# Patient Record
Sex: Female | Born: 1970
Health system: Southern US, Community
[De-identification: ages and names within clinical notes are randomized; demographics above are authoritative.]

## PROBLEM LIST (undated history)

## (undated) DIAGNOSIS — D649 Anemia, unspecified: Secondary | ICD-10-CM

## (undated) DIAGNOSIS — I10 Essential (primary) hypertension: Secondary | ICD-10-CM

## (undated) HISTORY — DX: Essential (primary) hypertension: I10

## (undated) HISTORY — DX: Anemia, unspecified: D64.9

## (undated) HISTORY — PX: CHOLECYSTECTOMY: SHX55

## (undated) HISTORY — PX: LAPAROSCOPIC SALPINGO OOPHERECTOMY: SHX5927

---

## 2000-07-31 ENCOUNTER — Observation Stay (HOSPITAL_COMMUNITY): Admission: RE | Admit: 2000-07-31 | Discharge: 2000-08-01 | Payer: Self-pay | Admitting: *Deleted

## 2000-08-16 ENCOUNTER — Other Ambulatory Visit: Admission: RE | Admit: 2000-08-16 | Discharge: 2000-08-16 | Payer: Self-pay | Admitting: *Deleted

## 2001-04-25 ENCOUNTER — Ambulatory Visit (HOSPITAL_COMMUNITY): Admission: RE | Admit: 2001-04-25 | Discharge: 2001-04-25 | Payer: Self-pay | Admitting: Obstetrics and Gynecology

## 2001-04-25 ENCOUNTER — Encounter: Payer: Self-pay | Admitting: Obstetrics and Gynecology

## 2001-10-04 ENCOUNTER — Ambulatory Visit (HOSPITAL_COMMUNITY): Admission: RE | Admit: 2001-10-04 | Discharge: 2001-10-04 | Payer: Self-pay | Admitting: Obstetrics and Gynecology

## 2001-10-04 ENCOUNTER — Encounter: Payer: Self-pay | Admitting: Obstetrics and Gynecology

## 2001-12-25 ENCOUNTER — Emergency Department (HOSPITAL_COMMUNITY): Admission: EM | Admit: 2001-12-25 | Discharge: 2001-12-25 | Payer: Self-pay | Admitting: Emergency Medicine

## 2003-09-27 ENCOUNTER — Other Ambulatory Visit: Admission: RE | Admit: 2003-09-27 | Discharge: 2003-09-27 | Payer: Self-pay | Admitting: Obstetrics and Gynecology

## 2005-05-05 ENCOUNTER — Other Ambulatory Visit: Admission: RE | Admit: 2005-05-05 | Discharge: 2005-05-05 | Payer: Self-pay | Admitting: Obstetrics and Gynecology

## 2005-05-14 ENCOUNTER — Ambulatory Visit (HOSPITAL_COMMUNITY): Admission: RE | Admit: 2005-05-14 | Discharge: 2005-05-14 | Payer: Self-pay | Admitting: Obstetrics and Gynecology

## 2010-02-15 ENCOUNTER — Encounter: Payer: Self-pay | Admitting: Obstetrics and Gynecology

## 2010-11-27 ENCOUNTER — Other Ambulatory Visit: Payer: Self-pay | Admitting: Obstetrics and Gynecology

## 2010-11-27 DIAGNOSIS — Z1231 Encounter for screening mammogram for malignant neoplasm of breast: Secondary | ICD-10-CM

## 2010-12-04 ENCOUNTER — Ambulatory Visit
Admission: RE | Admit: 2010-12-04 | Discharge: 2010-12-04 | Disposition: A | Discharge status: Home / Self Care | Payer: BC Managed Care – PPO | Source: Ambulatory Visit | Attending: Obstetrics and Gynecology | Admitting: Obstetrics and Gynecology

## 2010-12-04 DIAGNOSIS — Z1231 Encounter for screening mammogram for malignant neoplasm of breast: Secondary | ICD-10-CM

## 2010-12-07 ENCOUNTER — Other Ambulatory Visit: Payer: Self-pay | Admitting: Obstetrics and Gynecology

## 2010-12-07 DIAGNOSIS — R928 Other abnormal and inconclusive findings on diagnostic imaging of breast: Secondary | ICD-10-CM

## 2010-12-25 ENCOUNTER — Ambulatory Visit
Admission: RE | Admit: 2010-12-25 | Discharge: 2010-12-25 | Disposition: A | Discharge status: Home / Self Care | Payer: BC Managed Care – PPO | Source: Ambulatory Visit | Attending: Obstetrics and Gynecology | Admitting: Obstetrics and Gynecology

## 2010-12-25 DIAGNOSIS — R928 Other abnormal and inconclusive findings on diagnostic imaging of breast: Secondary | ICD-10-CM

## 2011-05-24 ENCOUNTER — Telehealth: Payer: Self-pay | Admitting: Obstetrics and Gynecology

## 2011-05-24 NOTE — Telephone Encounter (Signed)
Lm on vm tcb rgd msg 

## 2011-05-24 NOTE — Telephone Encounter (Signed)
Routed to triage 

## 2011-05-25 NOTE — Telephone Encounter (Signed)
Spoke with pt rgd msg pt wants eval for irreg bleeding pt has appt 06/01/11 at 4:15 with ND pt voice understanding

## 2011-06-01 ENCOUNTER — Ambulatory Visit (INDEPENDENT_AMBULATORY_CARE_PROVIDER_SITE_OTHER): Payer: BC Managed Care – PPO | Admitting: Obstetrics and Gynecology

## 2011-06-01 ENCOUNTER — Encounter: Payer: Self-pay | Admitting: Obstetrics and Gynecology

## 2011-06-01 VITALS — BP 120/80 | Ht 61.0 in | Wt 128.0 lb

## 2011-06-01 DIAGNOSIS — N898 Other specified noninflammatory disorders of vagina: Secondary | ICD-10-CM

## 2011-06-01 DIAGNOSIS — A499 Bacterial infection, unspecified: Secondary | ICD-10-CM

## 2011-06-01 DIAGNOSIS — N926 Irregular menstruation, unspecified: Secondary | ICD-10-CM

## 2011-06-01 DIAGNOSIS — N760 Acute vaginitis: Secondary | ICD-10-CM

## 2011-06-01 LAB — POCT WET PREP (WET MOUNT)
Clue Cells Wet Prep Whiff POC: POSITIVE
KOH Wet Prep POC: NEGATIVE

## 2011-06-01 MED ORDER — METRONIDAZOLE 0.75 % VA GEL: 1.0000 | Freq: Every day | VAGINAL | Status: AC

## 2011-06-01 NOTE — Progress Notes (Signed)
Contraception: yes Fibroids: yes Hormone Therapy: yes New Medications: no Menopausal Symptoms: no Vag. Discharge: yes Abdominal Pain: yes Increased Stress: no  Pt states started cycle 05/12/11 light bleeding x 3 days then a week later had heavy bleeding x 5 days then a week later bleeding again for 5 days with mild cramping.  The bleeding started a week before it was supposed to.  She has not missed any ocps.  No bleeding disorders.   Physical Examination: General appearance - alert, well appearing, and in no distress Mental status - alert, oriented to person, place, and time Chest - clear to auscultation, no wheezes, rales or rhonchi, symmetric air entry Heart - normal rate and regular rhythm Abdomen - soft, nontender, nondistended, no masses or organomegaly Pelvic - normal external genitalia, vulva, vagina, cervix, uterus and adnexa.  Mild uterine tenderness and left adnexal tenderness Back exam - full range of motion, no tenderness, palpable spasm or pain on motion Musculoskeletal - no joint tenderness, deformity or swelling Extremities - peripheral pulses normal, no pedal edema, no clubbing or cyanosis Skin - normal coloration and turgor, no rashes, no suspicious skin lesions Rectal:  External hemorrhoid seen. Red and non tender Irregular vb Hemorrhoid BV wet prep sig for bv Pt declined  gc and chlamydia cultures US/SHG/EMBX @[facility]  Pt with osteopenia recommend to try different bc @[facility] 

## 2011-06-03 ENCOUNTER — Other Ambulatory Visit: Payer: Self-pay | Admitting: Obstetrics and Gynecology

## 2011-06-03 ENCOUNTER — Other Ambulatory Visit: Payer: Self-pay

## 2011-06-03 MED ORDER — HYDROCORTISONE ACE-PRAMOXINE 1-1 % RE FOAM: 1.0000 | Freq: Two times a day (BID) | RECTAL | Status: AC

## 2011-06-03 NOTE — Telephone Encounter (Signed)
Lm on vm tcb rgd msg 

## 2011-06-03 NOTE — Telephone Encounter (Signed)
Lm on vm rx sent to pharm 

## 2011-06-03 NOTE — Telephone Encounter (Signed)
Niccole addressing call

## 2011-06-04 ENCOUNTER — Telehealth: Payer: Self-pay | Admitting: Obstetrics and Gynecology

## 2011-06-04 NOTE — Telephone Encounter (Signed)
Hey Dr.D this pt was given rx for procto foam insurance will not cover is there something else we can call in

## 2011-06-04 NOTE — Telephone Encounter (Signed)
niccole/epic °

## 2011-06-09 MED ORDER — HYDROCORTISONE ACETATE 1 % EX OINT: TOPICAL_OINTMENT | CUTANEOUS | Status: DC

## 2011-06-09 NOTE — Telephone Encounter (Signed)
Lm on vm rx sent to pharm can pick up at early convience

## 2011-06-09 NOTE — Telephone Encounter (Signed)
Yes can use Annusol HC ointment BiD

## 2011-06-23 ENCOUNTER — Encounter: Payer: BC Managed Care – PPO | Admitting: Obstetrics and Gynecology

## 2011-06-28 ENCOUNTER — Encounter: Payer: BC Managed Care – PPO | Admitting: Obstetrics and Gynecology

## 2011-08-05 ENCOUNTER — Other Ambulatory Visit: Payer: Self-pay | Admitting: Obstetrics and Gynecology

## 2011-08-05 ENCOUNTER — Other Ambulatory Visit: Payer: Self-pay

## 2011-08-05 NOTE — Telephone Encounter (Signed)
NICCOLE/EPIC °

## 2011-08-06 ENCOUNTER — Other Ambulatory Visit: Payer: Self-pay

## 2011-08-06 MED ORDER — LOESTRIN 1/20 (21) 1-20 MG-MCG PO TABS: 1.0000 | ORAL_TABLET | Freq: Every day | ORAL | Status: DC

## 2011-08-06 MED ORDER — JUNEL FE 1/20 1-20 MG-MCG PO TABS: 1.0000 | ORAL_TABLET | Freq: Every day | ORAL | Status: DC

## 2011-08-06 NOTE — Telephone Encounter (Signed)
Lm on vm rx sent to pharm per protocol 

## 2011-09-06 ENCOUNTER — Other Ambulatory Visit: Payer: Self-pay

## 2011-09-06 MED ORDER — JUNEL FE 1/20 1-20 MG-MCG PO TABS: 1.0000 | ORAL_TABLET | Freq: Every day | ORAL | Status: DC

## 2011-09-30 ENCOUNTER — Telehealth: Payer: Self-pay

## 2011-09-30 ENCOUNTER — Other Ambulatory Visit: Payer: Self-pay

## 2011-09-30 MED ORDER — JUNEL FE 1/20 1-20 MG-MCG PO TABS: 1.0000 | ORAL_TABLET | Freq: Every day | ORAL | Status: DC

## 2011-09-30 NOTE — Telephone Encounter (Signed)
Lm on vm rx sent to pharm 

## 2011-10-11 ENCOUNTER — Ambulatory Visit: Payer: BC Managed Care – PPO | Admitting: Obstetrics and Gynecology

## 2011-10-27 ENCOUNTER — Telehealth: Payer: Self-pay | Admitting: Obstetrics and Gynecology

## 2011-10-27 NOTE — Telephone Encounter (Signed)
Approved rx refill request from pharmacy [facility] fe1-20 disp 1 pack 1po qd with 1 refill. Pt has a aex coming up. Pt aware of rx .

## 2011-11-19 ENCOUNTER — Other Ambulatory Visit: Payer: Self-pay | Admitting: Obstetrics and Gynecology

## 2011-11-19 DIAGNOSIS — Z1231 Encounter for screening mammogram for malignant neoplasm of breast: Secondary | ICD-10-CM

## 2011-12-03 ENCOUNTER — Ambulatory Visit (INDEPENDENT_AMBULATORY_CARE_PROVIDER_SITE_OTHER): Payer: BC Managed Care – PPO | Admitting: Obstetrics and Gynecology

## 2011-12-03 ENCOUNTER — Encounter: Payer: Self-pay | Admitting: Obstetrics and Gynecology

## 2011-12-03 VITALS — BP 116/74 | Ht 61.0 in | Wt 131.0 lb

## 2011-12-03 DIAGNOSIS — Z124 Encounter for screening for malignant neoplasm of cervix: Secondary | ICD-10-CM

## 2011-12-03 DIAGNOSIS — Z0000 Encounter for general adult medical examination without abnormal findings: Secondary | ICD-10-CM

## 2011-12-03 NOTE — Patient Instructions (Signed)
Laparoscopic Tubal Ligation Laparoscopic tubal ligation is a procedure that closes the fallopian tubes at a time other than right after childbirth. By closing the fallopian tubes, the eggs that are released from the ovaries cannot enter the uterus and sperm cannot reach the egg. Tubal ligation is also known as getting your "tubes tied." Tubal ligation is done so you will not be able to get pregnant or have a baby.  Although this procedure may be reversed, it should be considered permanent and irreversible. If you want to have future pregnancies, you should not have this procedure.  LET YOUR CAREGIVER KNOW ABOUT:  Allergies to food or medicine.  Medicines taken, including vitamins, herbs, eyedrops, over-the-counter medicines, and creams.  Use of steroids (by mouth or creams).  Previous problems with numbing medicines.  History of bleeding problems or blood clots.  Any recent colds or infections.  Previous surgery.  Other health problems, including diabetes and kidney problems.  Possibility of pregnancy, if this applies.  Any past pregnancies. RISKS AND COMPLICATIONS   Infection.  Bleeding.  Injury to surrounding organs.  Anesthetic side effects.  Failure of the procedure.  Ectopic pregnancy.  Future regret about having the procedure done. BEFORE THE PROCEDURE  Do not take aspirin or blood thinners a week before the procedure or as directed. This can cause bleeding.  Do not eat or drink anything 6 to 8 hours before the procedure. PROCEDURE   You may be given a medicine to help you relax (sedative) before the procedure. You will be given a medicine to make you sleep (general anesthetic) during the procedure.  A tube will be put down your throat to help your breath while under general anesthesia.  Two small cuts (incisions) are made in the lower abdominal area and near the belly button.  Your abdominal area will be inflated with a safe gas (carbon dioxide). This helps  give the surgeon room to operate, visualize, and helps the surgeon avoid other organs.  A thin, lighted tube (laparoscope) with a camera attached is inserted into your abdomen through one of the incisions near the belly button. Other small instruments are also inserted through the other abdominal incision.  The fallopian tubes are located and are either blocked with a ring, clip, or are burned (cauterized).  After the fallopian tubes are blocked, the gas is released from the abdomen.  The incisions will be closed with stitches (sutures), and a bandage may be placed over the incisions. AFTER THE PROCEDURE   You will rest in a recovery room for 1 4 hours until you are stable and doing well.  You will also have some mild abdominal discomfort for 3 7 days. You will be given pain medicine to ease any discomfort.  As long as there are no problems, you may be allowed to go home. Someone will need to drive you home and be with you for at least 24 hours once home.  You may have some mild discomfort in the throat. This is from the tube placed in your throat while you were sleeping.  You may experience discomfort in the shoulder area from some trapped air between the liver and diaphragm. This sensation is normal and will slowly go away on its own. Document Released: 04/19/2000 Document Revised: 07/13/2011 Document Reviewed: 04/24/2011 Exit[employer] Patient Information 2013 [employer].  

## 2011-12-03 NOTE — Progress Notes (Signed)
Last Pap: 2012 WNL: Yes Regular Periods:yes Contraception: junel  Monthly Breast exam:yes Tetanus<45yrs:yes Nl.Bladder Function:yes Daily BMs:yes Healthy Diet:yes Calcium:yes Mammogram:yes Date of Mammogram: 2012 wnl schd for next month Exercise:yes Have often Exercise: occ Seatbelt: yes Abuse at home: no Stressful work:no Sigmoid-colonoscopy: n/a Bone Density: No PCP: Dr.Payne Change in PMH: no change Change in FMH:no change BP 116/74  Ht 5\' 1"  (1.549 m)  Wt 131 lb (59.421 kg)  BMI 24.75 kg/m2  LMP 11/26/2011 Pt with complaints:yes Physical Examination: General appearance - alert, well appearing, and in no distress Mental status - normal mood, behavior, speech, dress, motor activity, and thought processes Neck - supple, no significant adenopathy,  thyroid exam: thyroid is normal in size without nodules or tenderness Chest - clear to auscultation, no wheezes, rales or rhonchi, symmetric air entry Heart - normal rate and regular rhythm Abdomen - soft, nontender, nondistended, no masses or organomegaly Breasts - breasts appear normal, no suspicious masses, no skin or nipple changes or axillary nodes Pelvic - normal external genitalia, vulva, vagina, cervix, uterus and adnexa Rectal - normal rectal, no masses, thrombosed external hemorrhoids Back exam - full range of motion, no tenderness, palpable spasm or pain on motion Neurological - alert, oriented, normal speech, no focal findings or movement disorder noted Musculoskeletal - no joint tenderness, deformity or swelling Extremities - no edema, redness or tenderness in the calves or thighs Skin - normal coloration and turgor, no rashes, no suspicious skin lesions noted Routine exam Pap sent no due 2015 Mammogram due yes scheduled next week OCP used for contraception.  Pt considering a tubal ligation.  She only has one remaining tube.  Info given on tubal RT 1 yr

## 2011-12-06 ENCOUNTER — Telehealth: Payer: Self-pay

## 2011-12-06 NOTE — Telephone Encounter (Signed)
Message copied by Rolla Plate on Mon Dec 06, 2011  9:26 AM ------      Message from: Jaymes Graff      Created: Fri Dec 03, 2011 10:04 AM       Please refer pt to Dr Loreta Ave to treat hemorrhoids

## 2011-12-06 NOTE — Telephone Encounter (Signed)
Spoke with pt informed appt time and date pt voice understanding 

## 2011-12-06 NOTE — Telephone Encounter (Signed)
Lm on vm tcb rgd referral pt has appt with Dr.Mann 12-14-11 at 10:30

## 2011-12-22 ENCOUNTER — Other Ambulatory Visit: Payer: Self-pay

## 2011-12-22 MED ORDER — NORETHIN ACE-ETH ESTRAD-FE 1-20 MG-MCG PO TABS: 1.0000 | ORAL_TABLET | Freq: Every day | ORAL | Status: DC

## 2011-12-31 ENCOUNTER — Ambulatory Visit
Admission: RE | Admit: 2011-12-31 | Discharge: 2011-12-31 | Disposition: A | Discharge status: Home / Self Care | Payer: BC Managed Care – PPO | Source: Ambulatory Visit | Attending: Obstetrics and Gynecology | Admitting: Obstetrics and Gynecology

## 2011-12-31 DIAGNOSIS — Z1231 Encounter for screening mammogram for malignant neoplasm of breast: Secondary | ICD-10-CM

## 2012-01-04 ENCOUNTER — Ambulatory Visit (INDEPENDENT_AMBULATORY_CARE_PROVIDER_SITE_OTHER): Payer: BC Managed Care – PPO | Admitting: General Surgery

## 2012-01-04 ENCOUNTER — Encounter (INDEPENDENT_AMBULATORY_CARE_PROVIDER_SITE_OTHER): Payer: Self-pay | Admitting: General Surgery

## 2012-01-04 VITALS — BP 130/86 | HR 80 | Temp 97.7°F | Resp 16 | Ht 61.0 in | Wt 130.8 lb

## 2012-01-04 DIAGNOSIS — K620 Anal polyp: Secondary | ICD-10-CM | POA: Insufficient documentation

## 2012-01-04 DIAGNOSIS — K621 Rectal polyp: Secondary | ICD-10-CM | POA: Insufficient documentation

## 2012-01-04 NOTE — Progress Notes (Signed)
Patient ID: Kathleen Orozco, female   DOB: [birth year], 41 y.o.   MRN: [id number]  No chief complaint on file.   HPI Kathleen Orozco is a 41 y.o. female.   HPI  She is referred by Dr. Loreta Ave for evaluation of a bleeding anal mass. She has been noticing post bowel movement blood on the tissue paper for about 4-5 months. No pain. She denies constipation. She saw Dr. Loreta Ave who identified a lesion prolapsing out of the anus. She has been sent over here for further evaluation and treatment.  Past Medical History  Diagnosis Date  . Hypertension   . Anemia     Past Surgical History  Procedure Date  . Cholecystectomy   . Laparoscopic salpingo oopherectomy     Family History  Problem Relation Age of Onset  . Hypertension Maternal Grandmother   . Breast cancer Cousin     Social History History  Substance Use Topics  . Smoking status: Never Smoker   . Smokeless tobacco: Not on file  . Alcohol Use: Not on file    Allergies  Allergen Reactions  . Reglan (Metoclopramide)     Current Outpatient Prescriptions  Medication Sig Dispense Refill  . hydrochlorothiazide (MICROZIDE) 12.5 MG capsule Take 12.5 mg by mouth daily.      . Hydrocortisone Acetate 1 % OINT Apply to area twice daily  1 Tube  0  . JUNEL FE 1/20 1-20 MG-MCG tablet Take 1 tablet by mouth daily.  1 Package  0  . norethindrone-ethinyl estradiol (JUNEL FE,GILDESS FE,LOESTRIN FE) 1-20 MG-MCG tablet Take 1 tablet by mouth daily.  1 Package  11  . Vitamin D, Ergocalciferol, (DRISDOL) 50000 UNITS CAPS Take 50,000 Units by mouth.        Review of Systems Review of Systems  Constitutional: Negative.   Respiratory: Negative.   Cardiovascular: Negative.   Gastrointestinal: Positive for anal bleeding.  Genitourinary: Negative.   Neurological: Positive for headaches.    Blood pressure 130/86, pulse 80, temperature 97.7 F (36.5 C), resp. rate 16, height 5\' 1"  (1.549 m), weight 130 lb 12.8 oz (59.33 kg), last menstrual period  12/17/2011.  Physical Exam Physical Exam  Constitutional: She appears well-developed and well-nourished. No distress.  Genitourinary:       Anorectal-right posterior lateral skin tag is noted. There is a prolapsing inflamed anal mass present. It is non-tender. It bleeds slightly when touched. No other anal masses noted. Normal sphincter tone on digital rectal exam.    Data Reviewed Knows from Dr. Loreta Ave.  Assessment    Bleeding, prolapsing anal mass. Most likely in an inflamed internal hemorrhoid versus benign anal lesion. It is not amenable to office treatment.    Plan    Removal of anal mass at outpatient surgical Center. The procedure, rationale, and risks were discussed with her. The risks include but are not limited to bleeding, infection, anesthesia, recurrence. We also talked about light activities afterwards. She seems to understand this. She will call back when she wants to schedule the surgery.       Han Vejar J 01/04/2012, 3:57 PM

## 2012-01-04 NOTE — Patient Instructions (Signed)
Call when you are ready to schedule surgery, [phone], and ask for a surgery scheduler.

## 2012-01-10 ENCOUNTER — Encounter: Payer: Self-pay | Admitting: Obstetrics and Gynecology

## 2012-01-11 ENCOUNTER — Encounter (INDEPENDENT_AMBULATORY_CARE_PROVIDER_SITE_OTHER): Payer: Self-pay

## 2012-01-13 ENCOUNTER — Other Ambulatory Visit (INDEPENDENT_AMBULATORY_CARE_PROVIDER_SITE_OTHER): Payer: Self-pay | Admitting: General Surgery

## 2012-01-13 ENCOUNTER — Telehealth (INDEPENDENT_AMBULATORY_CARE_PROVIDER_SITE_OTHER): Payer: Self-pay

## 2012-01-13 NOTE — Telephone Encounter (Signed)
LMOV to call office.  Pt needs to stop ASA 5 days prior to her surgery.  Orders written today by Dr. Abbey Chatters.

## 2012-01-14 ENCOUNTER — Telehealth (INDEPENDENT_AMBULATORY_CARE_PROVIDER_SITE_OTHER): Payer: Self-pay | Admitting: General Surgery

## 2012-01-14 NOTE — Telephone Encounter (Signed)
Patient called back this morning and I read message to her that she need to stop ASA 5 days before surgery and she wanted to talk to scheduler and I transferred to them

## 2012-01-24 ENCOUNTER — Ambulatory Visit (INDEPENDENT_AMBULATORY_CARE_PROVIDER_SITE_OTHER): Payer: Self-pay | Admitting: General Surgery

## 2012-02-04 ENCOUNTER — Ambulatory Visit (INDEPENDENT_AMBULATORY_CARE_PROVIDER_SITE_OTHER): Payer: Self-pay | Admitting: General Surgery

## 2012-03-23 DIAGNOSIS — K648 Other hemorrhoids: Secondary | ICD-10-CM

## 2012-03-23 HISTORY — PX: MASS EXCISION: SHX2000

## 2012-03-24 ENCOUNTER — Telehealth (INDEPENDENT_AMBULATORY_CARE_PROVIDER_SITE_OTHER): Payer: Self-pay | Admitting: General Surgery

## 2012-03-24 NOTE — Telephone Encounter (Signed)
Left message for patient to call office for po visit day and time  I also mailed  appt card

## 2012-03-24 NOTE — Telephone Encounter (Signed)
Patient called back and updated that her PO appt is 04/20/12 and that we need her to arrive at 230p for a 250p appt.  Patient agreeable with appt time at this time.

## 2012-04-20 ENCOUNTER — Ambulatory Visit (INDEPENDENT_AMBULATORY_CARE_PROVIDER_SITE_OTHER): Payer: BC Managed Care – PPO | Admitting: General Surgery

## 2012-04-20 ENCOUNTER — Encounter (INDEPENDENT_AMBULATORY_CARE_PROVIDER_SITE_OTHER): Payer: Self-pay | Admitting: General Surgery

## 2012-04-20 VITALS — BP 110/80 | HR 84 | Temp 98.0°F | Resp 16 | Ht 61.0 in | Wt 133.0 lb

## 2012-04-20 DIAGNOSIS — Z9889 Other specified postprocedural states: Secondary | ICD-10-CM

## 2012-04-20 NOTE — Progress Notes (Signed)
Procedure:  Excision of prolapsing anal mass and right single column hemorrhoidectomy  Date:  03/23/2012  Pathology:  Benign hemorrhoidal tissue  History: She is here for her first postoperative visit and is doing well. If she has a hard stool, she may see a little bleeding after having a bowel movement.  Exam: General- Is in NAD. Anorectal-very small open wound present around 7:00 position internally.  Assessment:  Doing well postoperatively to  Plan:  Start high-fiber diet and stay well hydrated. Return as needed.

## 2012-04-20 NOTE — Patient Instructions (Addendum)
Try a high fiber diet with more raw fruits, raw vegetables, and grains. Stay well hydrated.

## 2012-12-11 ENCOUNTER — Other Ambulatory Visit: Payer: Self-pay

## 2012-12-11 DIAGNOSIS — Z1231 Encounter for screening mammogram for malignant neoplasm of breast: Secondary | ICD-10-CM

## 2013-01-12 ENCOUNTER — Ambulatory Visit
Admission: RE | Admit: 2013-01-12 | Discharge: 2013-01-12 | Disposition: A | Discharge status: Home / Self Care | Payer: BC Managed Care – PPO | Source: Ambulatory Visit

## 2013-01-12 DIAGNOSIS — Z1231 Encounter for screening mammogram for malignant neoplasm of breast: Secondary | ICD-10-CM

## 2013-06-15 ENCOUNTER — Other Ambulatory Visit: Payer: Self-pay | Admitting: Obstetrics and Gynecology

## 2013-07-16 ENCOUNTER — Other Ambulatory Visit (HOSPITAL_COMMUNITY): Payer: Self-pay | Admitting: Obstetrics and Gynecology

## 2013-07-16 NOTE — H&P (Signed)
Kathleen Orozco is a 43 y.o. female  P  1-0-1-1 for tubal sterilization because of her desire to prevent childbearing.  The patient has a history of heavy menses that she describes as lasting for 5 days with a super tampon change 5 times a day.  These periods would be  accompanied by cramping that she rated as 4/10 on a 10 point pain scale.  Subsequently, she began oral contraceptives that now have her only needing a panty liner twice a day and no cramping at all for her 3 day "flow".  She denies any bladder or bowel issues, inter-menstrual bleeding or dyspareunia.  She has decided that she no longer wants to preserve her childbearing potential and has therefore consented to undergo bilateral salpingectomy.   Past Medical History  OB History: G: 2,   P: 1-0-1-1;  SVB 1989  GYN History: menarche:43 YO    LMP: 07/09/2013    Contracepton: Birth Control Pills  Denies history of abnormal PAP smear or STDs;  Last PAP smear: 2012  Medical History: Hypertension, Vitamin D Deficiency and Anemia  Surgical History: 1990 Cholecystecomy;  2002 Laparoscopic Salpingectomy;  2014 Excision of Anal Polyp Denies problems with anesthesia or history of blood transfusions  Family History: Hypertension and Breast Cancer (maternal cousin in 56th decade)  Social History: Widow;  Denies tobacco use but occasionally uses alcohol   Medication  Valsartan 160 mg-HCTZ 25 mg daily Gildess 1/20 daily Vitamin D 50, 000 units weekly   Allergies  Allergen Reactions  . Reglan [Metoclopramide]     Denies sensitivity to peanuts, shellfish, soy, latex or adhesives.   ROS: Admits to glasses but   denies headache, vision changes, nasal congestion, dysphagia, tinnitus, dizziness, hoarseness, cough,  chest pain, shortness of breath, nausea, vomiting, diarrhea,constipation,  urinary frequency, urgency  dysuria, hematuria, vaginitis symptoms, pelvic pain, swelling of joints,easy bruising,  myalgias, arthralgias, skin rashes,  unexplained weight loss and except as is mentioned in the history of present illness, patient's review of systems is otherwise negative.  Physical Exam  Bp: 116/70   P: 80   R: 18  Temperature: 99.1 degrees F orally    Weight: 138 lbs.  Height: 5'1"   BMI: 26.1  Neck: supple without masses or thyromegaly Lungs: clear to auscultation Heart: regular rate and rhythm Abdomen: soft, non-tender and no organomegaly Pelvic:EGBUS- wnl; vagina-normal rugae; uterus-normal size, cervix without lesions or motion tenderness; adnexae-no tenderness or masses Extremities:  no clubbing, cyanosis or edema   Assesment: Desire for Permanent Sterilzation   Disposition:  A discussion was held with patient regarding the indication for her procedure(s) along with the risks, which include but are not limited to: reaction to anesthesia, damage to adjacent organs, infection and excessive bleeding.  The patient verbalized understanding of these risks and has consented to proceed with Bilateral Salpingectomy at [place] [facility] on July 19, 2013.   CSN# [id number]   [place] J. Florene Glen, PA-C  for Dr. Franklyn Lor. Dillard

## 2013-07-17 ENCOUNTER — Encounter (HOSPITAL_COMMUNITY)
Admission: RE | Admit: 2013-07-17 | Discharge: 2013-07-17 | Disposition: A | Discharge status: Home / Self Care | Payer: BC Managed Care – PPO | Source: Ambulatory Visit | Attending: Obstetrics and Gynecology | Admitting: Obstetrics and Gynecology

## 2013-07-17 ENCOUNTER — Other Ambulatory Visit: Payer: Self-pay

## 2013-07-17 ENCOUNTER — Encounter (HOSPITAL_COMMUNITY): Payer: Self-pay

## 2013-07-17 LAB — BASIC METABOLIC PANEL
BUN: 10 mg/dL (ref 6–23)
CO2: 27 mEq/L (ref 19–32)
Calcium: 9.8 mg/dL (ref 8.4–10.5)
Chloride: 101 mEq/L (ref 96–112)
Creatinine, Ser: 0.84 mg/dL (ref 0.50–1.10)
GFR calc Af Amer: >90 mL/min (ref >90)
GFR, EST NON AFRICAN AMERICAN: 84 mL/min — AB (ref >90)
GLUCOSE: 110 mg/dL — AB (ref 70–99)
POTASSIUM: 4 meq/L (ref 3.7–5.3)
SODIUM: 139 meq/L (ref 137–147)

## 2013-07-17 LAB — CBC
HCT: 42.7 % (ref 36.0–46.0)
HEMOGLOBIN: 14.2 g/dL (ref 12.0–15.0)
MCH: 29.8 pg (ref 26.0–34.0)
MCHC: 33.3 g/dL (ref 30.0–36.0)
MCV: 89.5 fL (ref 78.0–100.0)
Platelets: 370 10*3/uL (ref 150–400)
RBC: 4.77 MIL/uL (ref 3.87–5.11)
RDW: 13.3 % (ref 11.5–15.5)
WBC: 5.2 10*3/uL (ref 4.0–10.5)

## 2013-07-17 NOTE — Patient Instructions (Signed)
Your procedure is scheduled on:07/19/13  Enter through the Main Entrance at :[facility] up desk phone and dial [phone] and inform us of your arrival.  Please call [phone] if you have any problems the morning of surgery.  Remember: Do not eat food or drink liquids, including water, after midnight:WED.   You may brush your teeth the morning of surgery.   Take these meds the morning of surgery with a sip of water:Valsartan  DO NOT wear jewelry, eye make-up, lipstick,body lotion, or fingernail polish.  (Polished toes are ok) You may wear deodorant.   Patients discharged on the day of surgery will not be allowed to drive home. Wear loose fitting, comfortable clothes for your ride home.

## 2013-07-19 ENCOUNTER — Ambulatory Visit (HOSPITAL_COMMUNITY): Payer: BC Managed Care – PPO | Admitting: Anesthesiology

## 2013-07-19 ENCOUNTER — Ambulatory Visit (HOSPITAL_COMMUNITY)
Admission: RE | Admit: 2013-07-19 | Discharge: 2013-07-19 | Disposition: A | Discharge status: Home / Self Care | Payer: BC Managed Care – PPO | Source: Ambulatory Visit | Attending: Obstetrics and Gynecology | Admitting: Obstetrics and Gynecology

## 2013-07-19 ENCOUNTER — Encounter (HOSPITAL_COMMUNITY)
Admission: RE | Disposition: A | Discharge status: Home / Self Care | Payer: Self-pay | Source: Ambulatory Visit | Attending: Obstetrics and Gynecology

## 2013-07-19 ENCOUNTER — Encounter (HOSPITAL_COMMUNITY): Payer: BC Managed Care – PPO | Admitting: Anesthesiology

## 2013-07-19 DIAGNOSIS — I10 Essential (primary) hypertension: Secondary | ICD-10-CM | POA: Insufficient documentation

## 2013-07-19 DIAGNOSIS — D649 Anemia, unspecified: Secondary | ICD-10-CM | POA: Insufficient documentation

## 2013-07-19 DIAGNOSIS — D259 Leiomyoma of uterus, unspecified: Secondary | ICD-10-CM | POA: Insufficient documentation

## 2013-07-19 DIAGNOSIS — Z641 Problems related to multiparity: Secondary | ICD-10-CM | POA: Insufficient documentation

## 2013-07-19 DIAGNOSIS — Z302 Encounter for sterilization: Secondary | ICD-10-CM | POA: Insufficient documentation

## 2013-07-19 DIAGNOSIS — Z888 Allergy status to other drugs, medicaments and biological substances status: Secondary | ICD-10-CM | POA: Insufficient documentation

## 2013-07-19 HISTORY — PX: LAPAROSCOPIC BILATERAL SALPINGECTOMY: SHX5889

## 2013-07-19 LAB — PREGNANCY, URINE: PREG TEST UR: NEGATIVE

## 2013-07-19 SURGERY — SALPINGECTOMY, BILATERAL, LAPAROSCOPIC
Anesthesia: General | Site: Abdomen | Laterality: Right

## 2013-07-19 MED ORDER — PROMETHAZINE HCL 25 MG/ML IJ SOLN: 6.2500 mg | INTRAMUSCULAR | Status: DC | PRN

## 2013-07-19 MED ORDER — NEOSTIGMINE METHYLSULFATE 10 MG/10ML IV SOLN
INTRAVENOUS | Status: AC
  Filled 2013-07-19: qty 1

## 2013-07-19 MED ORDER — 0.9 % SODIUM CHLORIDE (POUR BTL) OPTIME
TOPICAL | Status: DC | PRN
  Administered 2013-07-19: 1000 mL

## 2013-07-19 MED ORDER — PROPOFOL 10 MG/ML IV BOLUS
INTRAVENOUS | Status: DC | PRN
  Administered 2013-07-19: 150 mg via INTRAVENOUS
  Administered 2013-07-19: 50 mg via INTRAVENOUS

## 2013-07-19 MED ORDER — DEXAMETHASONE SODIUM PHOSPHATE 10 MG/ML IJ SOLN
INTRAMUSCULAR | Status: AC
  Filled 2013-07-19: qty 1

## 2013-07-19 MED ORDER — GLYCOPYRROLATE 0.2 MG/ML IJ SOLN
INTRAMUSCULAR | Status: AC
  Filled 2013-07-19: qty 3

## 2013-07-19 MED ORDER — KETOROLAC TROMETHAMINE 30 MG/ML IJ SOLN
INTRAMUSCULAR | Status: AC
  Filled 2013-07-19: qty 1

## 2013-07-19 MED ORDER — IBUPROFEN 600 MG PO TABS: 600.0000 mg | ORAL_TABLET | Freq: Three times a day (TID) | ORAL | Status: DC | PRN

## 2013-07-19 MED ORDER — BUPIVACAINE-EPINEPHRINE 0.25% -1:200000 IJ SOLN
INTRAMUSCULAR | Status: DC | PRN
  Administered 2013-07-19: 14 mL

## 2013-07-19 MED ORDER — LIDOCAINE HCL (CARDIAC) 20 MG/ML IV SOLN
INTRAVENOUS | Status: AC
  Filled 2013-07-19: qty 5

## 2013-07-19 MED ORDER — DEXAMETHASONE SODIUM PHOSPHATE 10 MG/ML IJ SOLN
INTRAMUSCULAR | Status: DC | PRN
  Administered 2013-07-19: 10 mg via INTRAVENOUS

## 2013-07-19 MED ORDER — BUPIVACAINE-EPINEPHRINE (PF) 0.25% -1:200000 IJ SOLN
INTRAMUSCULAR | Status: AC
  Filled 2013-07-19: qty 30

## 2013-07-19 MED ORDER — ROCURONIUM BROMIDE 100 MG/10ML IV SOLN
INTRAVENOUS | Status: DC | PRN
  Administered 2013-07-19: 40 mg via INTRAVENOUS

## 2013-07-19 MED ORDER — OXYCODONE-ACETAMINOPHEN 5-325 MG PO TABS: 1.0000 | ORAL_TABLET | Freq: Four times a day (QID) | ORAL | Status: DC | PRN

## 2013-07-19 MED ORDER — FENTANYL CITRATE 0.05 MG/ML IJ SOLN
INTRAMUSCULAR | Status: DC | PRN
  Administered 2013-07-19: 50 ug via INTRAVENOUS
  Administered 2013-07-19 (×2): 100 ug via INTRAVENOUS

## 2013-07-19 MED ORDER — MIDAZOLAM HCL 2 MG/2ML IJ SOLN
INTRAMUSCULAR | Status: DC | PRN
  Administered 2013-07-19: 2 mg via INTRAVENOUS

## 2013-07-19 MED ORDER — FENTANYL CITRATE 0.05 MG/ML IJ SOLN
INTRAMUSCULAR | Status: AC
  Filled 2013-07-19: qty 2

## 2013-07-19 MED ORDER — KETOROLAC TROMETHAMINE 30 MG/ML IJ SOLN: 15.0000 mg | Freq: Once | INTRAMUSCULAR | Status: DC | PRN

## 2013-07-19 MED ORDER — MIDAZOLAM HCL 2 MG/2ML IJ SOLN
INTRAMUSCULAR | Status: AC
  Filled 2013-07-19: qty 2

## 2013-07-19 MED ORDER — LACTATED RINGERS IV SOLN
INTRAVENOUS | Status: DC
  Administered 2013-07-19 (×3): via INTRAVENOUS

## 2013-07-19 MED ORDER — FENTANYL CITRATE 0.05 MG/ML IJ SOLN
INTRAMUSCULAR | Status: AC
  Filled 2013-07-19: qty 5

## 2013-07-19 MED ORDER — LIDOCAINE HCL (CARDIAC) 20 MG/ML IV SOLN
INTRAVENOUS | Status: DC | PRN
  Administered 2013-07-19: 100 mg via INTRAVENOUS

## 2013-07-19 MED ORDER — FENTANYL CITRATE 0.05 MG/ML IJ SOLN: 25.0000 ug | INTRAMUSCULAR | Status: DC | PRN

## 2013-07-19 MED ORDER — ONDANSETRON HCL 4 MG/2ML IJ SOLN
INTRAMUSCULAR | Status: AC
  Filled 2013-07-19: qty 2

## 2013-07-19 MED ORDER — PROPOFOL 10 MG/ML IV EMUL
INTRAVENOUS | Status: AC
  Filled 2013-07-19: qty 20

## 2013-07-19 MED ORDER — ROCURONIUM BROMIDE 100 MG/10ML IV SOLN
INTRAVENOUS | Status: AC
  Filled 2013-07-19: qty 1

## 2013-07-19 MED ORDER — MEPERIDINE HCL 25 MG/ML IJ SOLN: 6.2500 mg | INTRAMUSCULAR | Status: DC | PRN

## 2013-07-19 SURGICAL SUPPLY — 26 items
CHLORAPREP W/TINT 26ML (MISCELLANEOUS) ×2 IMPLANT
CLOTH BEACON ORANGE TIMEOUT ST (SAFETY) ×2 IMPLANT
DERMABOND ADVANCED (GAUZE/BANDAGES/DRESSINGS) ×1
DERMABOND ADVANCED .7 DNX12 (GAUZE/BANDAGES/DRESSINGS) ×1 IMPLANT
DRSG COVADERM PLUS 2X2 (GAUZE/BANDAGES/DRESSINGS) ×4 IMPLANT
DRSG OPSITE POSTOP 3X4 (GAUZE/BANDAGES/DRESSINGS) ×2 IMPLANT
EVACUATOR SMOKE 8.L (FILTER) ×2 IMPLANT
FORCEPS CUTTING 33CM 5MM (CUTTING FORCEPS) ×2 IMPLANT
GLOVE BIO SURGEON STRL SZ 6.5 (GLOVE) ×2 IMPLANT
GLOVE BIOGEL PI IND STRL 7.0 (GLOVE) ×2 IMPLANT
GLOVE BIOGEL PI INDICATOR 7.0 (GLOVE) ×2
GOWN STRL REUS W/TWL LRG LVL3 (GOWN DISPOSABLE) ×4 IMPLANT
NS IRRIG 1000ML POUR BTL (IV SOLUTION) ×2 IMPLANT
PACK LAPAROSCOPY BASIN (CUSTOM PROCEDURE TRAY) ×2 IMPLANT
PROTECTOR NERVE ULNAR (MISCELLANEOUS) ×4 IMPLANT
SET IRRIG TUBING LAPAROSCOPIC (IRRIGATION / IRRIGATOR) IMPLANT
SOLUTION ELECTROLUBE (MISCELLANEOUS) ×2 IMPLANT
SUT MNCRL AB 3-0 PS2 27 (SUTURE) ×2 IMPLANT
SUT VICRYL 0 UR6 27IN ABS (SUTURE) ×4 IMPLANT
TOWEL OR 17X24 6PK STRL BLUE (TOWEL DISPOSABLE) ×4 IMPLANT
TRAY FOLEY CATH 14FR (SET/KITS/TRAYS/PACK) ×2 IMPLANT
TROCAR BALLN 12MMX100 BLUNT (TROCAR) ×2 IMPLANT
TROCAR XCEL NON-BLD 5MMX100MML (ENDOMECHANICALS) ×2 IMPLANT
TROCAR XCEL OPT SLVE 5M 100M (ENDOMECHANICALS) ×2 IMPLANT
WARMER LAPAROSCOPE (MISCELLANEOUS) ×2 IMPLANT
WATER STERILE IRR 1000ML POUR (IV SOLUTION) IMPLANT

## 2013-07-19 NOTE — Anesthesia Procedure Notes (Signed)
Procedure Name: Intubation Date/Time: 07/19/2013 9:43 AM Performed by: STERLING, Sheron Nightingale Pre-anesthesia Checklist: Patient identified, Patient being monitored, Emergency Drugs available, Timeout performed and Suction available Patient Re-evaluated:Patient Re-evaluated prior to inductionOxygen Delivery Method: Circle system utilized Preoxygenation: Pre-oxygenation with 100% oxygen Intubation Type: IV induction Laryngoscope Size: Mac and 3 Grade View: Grade I Tube size: 7.0 mm Number of attempts: 1 Placement Confirmation: ETT inserted through vocal cords under direct vision,  positive ETCO2 and breath sounds checked- equal and bilateral Secured at: 20 cm Dental Injury: Teeth and Oropharynx as per pre-operative assessment

## 2013-07-19 NOTE — Interval H&P Note (Signed)
History and Physical Interval Note:  07/19/2013 9:22 AM  Kathleen Orozco  has presented today for surgery, with the diagnosis of Desires surgical sterilization  The various methods of treatment have been discussed with the patient and family. After consideration of risks, benefits and other options for treatment, the patient has consented to  Procedure(s): LAPAROSCOPIC BILATERAL SALPINGECTOMY (Bilateral) as a surgical intervention .  The patient's history has been reviewed, patient examined, no change in status, stable for surgery.  I have reviewed the patient's chart and labs.  Questions were answered to the patient's satisfaction.     [facility]

## 2013-07-19 NOTE — Anesthesia Preprocedure Evaluation (Addendum)
Anesthesia Evaluation  Patient identified by MRN, date of birth, ID band Patient awake    Reviewed: Allergy & Precautions, H&P , NPO status , Patient's Chart, lab work & pertinent test results, reviewed documented beta blocker date and time   History of Anesthesia Complications Negative for: history of anesthetic complications  Airway Mallampati: III TM Distance: >3 FB Neck ROM: full    Dental  (+) Teeth Intact   Pulmonary neg pulmonary ROS,  breath sounds clear to auscultation  Pulmonary exam normal       Cardiovascular Exercise Tolerance: Good hypertension, On Medications Rhythm:regular Rate:Normal     Neuro/Psych negative neurological ROS  negative psych ROS   GI/Hepatic negative GI ROS, Neg liver ROS,   Endo/Other  negative endocrine ROS  Renal/GU negative Renal ROS  negative genitourinary   Musculoskeletal   Abdominal   Peds  Hematology negative hematology ROS (+)   Anesthesia Other Findings Allergy to Reglan  Reproductive/Obstetrics negative OB ROS                         Anesthesia Physical Anesthesia Plan  ASA: II  Anesthesia Plan: General ETT   Post-op Pain Management:    Induction:   Airway Management Planned:   Additional Equipment:   Intra-op Plan:   Post-operative Plan:   Informed Consent: I have reviewed the patients History and Physical, chart, labs and discussed the procedure including the risks, benefits and alternatives for the proposed anesthesia with the patient or authorized representative who has indicated his/her understanding and acceptance.   Dental Advisory Given  Plan Discussed with: CRNA and Surgeon  Anesthesia Plan Comments:         Anesthesia Quick Evaluation

## 2013-07-19 NOTE — Transfer of Care (Signed)
Immediate Anesthesia Transfer of Care Note  Patient: Kathleen Orozco  Procedure(s) Performed: Procedure(s): OPERATIVE LAPAROSCOPY, LYSIS OF ADHESIONS, LAPAROSCOPIC RIGHT SALPINGECTOMY (Right)  Patient Location: PACU  Anesthesia Type:General  Level of Consciousness: awake, alert  and oriented  Airway & Oxygen Therapy: Patient Spontanous Breathing and Patient connected to nasal cannula oxygen  Post-op Assessment: Report given to PACU RN and Post -op Vital signs reviewed and stable  Post vital signs: Reviewed and stable  Complications: No apparent anesthesia complications

## 2013-07-19 NOTE — H&P (View-Only) (Signed)
Kathleen Orozco is a 43 y.o. female  P  1-0-1-1 for tubal sterilization because of her desire to prevent childbearing.  The patient has a history of heavy menses that she describes as lasting for 5 days with a super tampon change 5 times a day.  These periods would be  accompanied by cramping that she rated as 4/10 on a 10 point pain scale.  Subsequently, she began oral contraceptives that now have her only needing a panty liner twice a day and no cramping at all for her 3 day "flow".  She denies any bladder or bowel issues, inter-menstrual bleeding or dyspareunia.  She has decided that she no longer wants to preserve her childbearing potential and has therefore consented to undergo bilateral salpingectomy.   Past Medical History  OB History: G: 2,   P: 1-0-1-1;  SVB 1989  GYN History: menarche:43 YO    LMP: 07/09/2013    Contracepton: Birth Control Pills  Denies history of abnormal PAP smear or STDs;  Last PAP smear: 2012  Medical History: Hypertension, Vitamin D Deficiency and Anemia  Surgical History: 1990 Cholecystecomy;  2002 Laparoscopic Salpingectomy;  2014 Excision of Anal Polyp Denies problems with anesthesia or history of blood transfusions  Family History: Hypertension and Breast Cancer (maternal cousin in 86th decade)  Social History: Widow;  Denies tobacco use but occasionally uses alcohol   Medication  Valsartan 160 mg-HCTZ 25 mg daily Gildess 1/20 daily Vitamin D 50, 000 units weekly   Allergies  Allergen Reactions  . Reglan [Metoclopramide]     Denies sensitivity to peanuts, shellfish, soy, latex or adhesives.   ROS: Admits to glasses but   denies headache, vision changes, nasal congestion, dysphagia, tinnitus, dizziness, hoarseness, cough,  chest pain, shortness of breath, nausea, vomiting, diarrhea,constipation,  urinary frequency, urgency  dysuria, hematuria, vaginitis symptoms, pelvic pain, swelling of joints,easy bruising,  myalgias, arthralgias, skin rashes,  unexplained weight loss and except as is mentioned in the history of present illness, patient's review of systems is otherwise negative.  Physical Exam  Bp: 116/70   P: 80   R: 18  Temperature: 99.1 degrees F orally    Weight: 138 lbs.  Height: 5'1"   BMI: 26.1  Neck: supple without masses or thyromegaly Lungs: clear to auscultation Heart: regular rate and rhythm Abdomen: soft, non-tender and no organomegaly Pelvic:EGBUS- wnl; vagina-normal rugae; uterus-normal size, cervix without lesions or motion tenderness; adnexae-no tenderness or masses Extremities:  no clubbing, cyanosis or edema   Assesment: Desire for Permanent Sterilzation   Disposition:  A discussion was held with patient regarding the indication for her procedure(s) along with the risks, which include but are not limited to: reaction to anesthesia, damage to adjacent organs, infection and excessive bleeding.  The patient verbalized understanding of these risks and has consented to proceed with Bilateral Salpingectomy at [facility] on July 19, 2013.   CSN# [id number]   [place] J. Florene Glen, PA-C  for Dr. Franklyn Lor. Dillard

## 2013-07-19 NOTE — Anesthesia Postprocedure Evaluation (Signed)
  Anesthesia Post-op Note  Anesthesia Post Note  Patient: Kathleen Orozco  Procedure(s) Performed: Procedure(s) (LRB): OPERATIVE LAPAROSCOPY, LYSIS OF ADHESIONS, LAPAROSCOPIC RIGHT SALPINGECTOMY (Right)  Anesthesia type: General  Patient location: PACU  Post pain: Pain level controlled  Post assessment: Post-op Vital signs reviewed  Last Vitals:  Filed Vitals:   07/19/13 1307  BP:   Pulse: 94  Temp:   Resp: 15    Post vital signs: Reviewed  Level of consciousness: sedated  Complications: No apparent anesthesia complications

## 2013-07-19 NOTE — Op Note (Signed)
Pre-operative Diagnosis: Multiparity desires sterilization  Post-operative Diagnosis: same  Surgeon: SEGBTDV,VOHYW A   Assistants: Earnstine Regal PA  Anesthesia: General endotracheal anesthesia   Procedure : Laparoscopic Right Salpingectomy, LOA  Procedure Details  The patient was seen in the Holding Room. The risks, benefits, complications, treatment options, and expected outcomes were discussed with the patient. The possibilities of reaction to medication, pulmonary aspiration, perforation of viscus, bleeding, recurrent infection, the need for additional procedures, failure to diagnose a condition, and creating a complication requiring transfusion or operation were discussed with the patient. The patient concurred with the proposed plan, giving informed consent. The patient was taken to the Operating Room, identified as Kathleen Orozco and the procedure verified as Diagnostic Laparoscopy with B sallpingecotmy. A Time Out was held and the above information confirmed.  After induction of general anesthesia, the patient was placed in modified dorsal lithotomy position where she was prepped, draped, and catheterized in the normal, sterile fashion. A foley catheter was placed..  The cervix was visualized and an intrauterine manipulator was placed. A 2 cm umbilical incision was then performed.and carried down to the fascia.  The fascia was then opened and extended bilaterally.  Peritoneum was then entered.  o vicryl was then placed around the fascia in a circumferential fashion.   The hasson was placed and ancored to the suture.. Normal pelvic anatomy was noted.   Uterus had  Two fibroids about 1-2 cm in size.  The pt had dense adhesions from the bowel to the anterior abdominal wall and from the bowel to the uterus.     The anterior and  Posterior culdesac and liver appeared normal.  The adhesions to the anterior abdominal wall were lysed with scissors before the 5 mmtrocars were placed.  The  adhesions were in the right lower quadrant.     Two 46mm trocars were placed in the right and left lower quadrants of the abdomen under direct visualization of the laparoscope.   The left tube was surgically absent.  The adhesions on the right side from the bowel to the uterus were lysed and the right tube and ovary were carefully diseccted out.  Using the gyrus the right fallopian tube was cauterized, cut and removed from the abdomen.   The tube was removed and sent to pathology .  Irrigation was done with 60 cc syringe.    Hemostasis was noted.  Air was allowed to leave the abdomen.  The abdomen was reinsufflated and hemostasis was still noted.     Following the procedure the umbilical hasson was removed after intra-abdominal carbon dioxide was expressed. The fascia was reaproximated by tying the 0 vicryl suture.   The 45mm skin incision was closed with dermabond.  The 10 mm incision was closed with a  subcuticular suture of 3-0 monocryl. The intrauterine manipulator was then removed.  The tenaculum site was oozing and made henmostatic with silver nitrate.   Instrument, sponge, and needle counts were correct prior to abdominal closure and at the conclusion of the case.  Findings: See above Estimated Blood Loss:  Minimal         Drains: none         Total IV Fluids:Intravenous fluids were administered, normal saline  m         Specimens: none              Complications:  None; patient tolerated the procedure well.         Disposition: PACU - hemodynamically  stable.         Condition: stable

## 2013-07-19 NOTE — Discharge Instructions (Signed)
Call [facility] @ [phone] if:  You have a temperature greater than or equal to 100.4 degrees Farenheit orally You have pain that is not made better by the pain medication given and taken as directed You have excessive bleeding or problems urinating  Take Colace (Docusate Sodium/Stool Softener) 100 mg 2-3 times daily while taking narcotic pain medicine to avoid constipation or until bowel movements are regular.  You may drive after 24 hours You may walk up steps You may shower tomorrow You may resume a regular diet Keep incisions clean and dry  Avoid anything in vagina  until after your post-operative visit  Laparoscopic Tubal Ligation Care After Refer to this sheet in the next few weeks. These instructions provide you with information on caring for yourself after your procedure. Your caregiver may also give you more specific instructions. Your treatment has been planned according to current medical practices, but problems sometimes occur. Call your caregiver if you have any problems or questions after your procedure.  HOME CARE INSTRUCTIONS   Rest the remainder of the day.  Only take over-the-counter or prescription medicines for pain, discomfort, or fever as directed by your caregiver. Do not take aspirin. It can cause bleeding.  Gradually resume daily activities, diet, rest, driving, and work.  Avoid sexual intercourse for 2 weeks or as directed.  Do not use tampons or douche.  Do not drive while taking pain medicine.  Do not lift anything over 5 pounds for 2 weeks or as directed.  Only take showers, not baths, until you are seen by your caregiver.  Change bandages (dressings) as directed.  Take your temperature twice a day and record it.  Try to have help for the first 7 to 10 days for your household needs.  Return to your caregiver to get your stitches (sutures) removed and for follow-up visits as directed.  SEEK MEDICAL CARE IF:   You have  redness, swelling, or increasing pain in a wound.  You have drainage from a wound lasting longer than 1 day.  Your pain is getting worse.  You have a rash.  You become dizzy or lightheaded.  You have a reaction to your medicine.  You need stronger medicine or a change in your pain medicine.  You notice a bad smell coming from a wound or dressing.  Your wound breaks open after the sutures have been removed.  You are constipated.  SEEK IMMEDIATE MEDICAL CARE IF:   You faint.  You have a fever.  You have increasing abdominal pain.  You have severe pain in your shoulders.  You have bleeding or drainage from the suture sites or vagina following surgery.  You have shortness of breath or difficulty breathing.  You have chest or leg pain.  You have persistent nausea, vomiting, or diarrhea.  MAKE SURE YOU:   Understand these instructions.  Watch your condition.  Get help right away if you are not doing well or get worse.  Document Released: 07/31/2004 Document Revised: 07/13/2011 Document Reviewed: 04/24/2011

## 2013-07-21 ENCOUNTER — Encounter (HOSPITAL_COMMUNITY): Payer: Self-pay | Admitting: Obstetrics and Gynecology

## 2013-12-27 ENCOUNTER — Other Ambulatory Visit: Payer: Self-pay

## 2013-12-27 DIAGNOSIS — Z1231 Encounter for screening mammogram for malignant neoplasm of breast: Secondary | ICD-10-CM

## 2014-01-15 ENCOUNTER — Ambulatory Visit
Admission: RE | Admit: 2014-01-15 | Discharge: 2014-01-15 | Disposition: A | Discharge status: Home / Self Care | Payer: BC Managed Care – PPO | Source: Ambulatory Visit

## 2014-01-15 DIAGNOSIS — Z1231 Encounter for screening mammogram for malignant neoplasm of breast: Secondary | ICD-10-CM

## 2014-02-01 ENCOUNTER — Ambulatory Visit: Payer: BC Managed Care – PPO

## 2016-03-08 DIAGNOSIS — N926 Irregular menstruation, unspecified: Secondary | ICD-10-CM | POA: Diagnosis not present

## 2016-03-08 DIAGNOSIS — Z124 Encounter for screening for malignant neoplasm of cervix: Secondary | ICD-10-CM | POA: Diagnosis not present

## 2016-03-08 DIAGNOSIS — Z01419 Encounter for gynecological examination (general) (routine) without abnormal findings: Secondary | ICD-10-CM | POA: Diagnosis not present

## 2016-03-08 DIAGNOSIS — N951 Menopausal and female climacteric states: Secondary | ICD-10-CM | POA: Diagnosis not present

## 2016-03-08 DIAGNOSIS — Z1231 Encounter for screening mammogram for malignant neoplasm of breast: Secondary | ICD-10-CM | POA: Diagnosis not present

## 2016-03-08 DIAGNOSIS — E559 Vitamin D deficiency, unspecified: Secondary | ICD-10-CM | POA: Diagnosis not present

## 2016-03-22 DIAGNOSIS — N921 Excessive and frequent menstruation with irregular cycle: Secondary | ICD-10-CM | POA: Diagnosis not present

## 2016-04-15 DIAGNOSIS — N921 Excessive and frequent menstruation with irregular cycle: Secondary | ICD-10-CM | POA: Diagnosis not present

## 2016-05-12 ENCOUNTER — Other Ambulatory Visit: Payer: Self-pay | Admitting: Obstetrics and Gynecology

## 2016-05-12 DIAGNOSIS — N921 Excessive and frequent menstruation with irregular cycle: Secondary | ICD-10-CM | POA: Diagnosis not present

## 2016-05-12 DIAGNOSIS — N840 Polyp of corpus uteri: Secondary | ICD-10-CM | POA: Diagnosis not present

## 2016-06-11 ENCOUNTER — Other Ambulatory Visit: Payer: Self-pay | Admitting: Obstetrics and Gynecology

## 2016-06-11 DIAGNOSIS — N858 Other specified noninflammatory disorders of uterus: Secondary | ICD-10-CM | POA: Diagnosis not present

## 2016-06-11 DIAGNOSIS — N921 Excessive and frequent menstruation with irregular cycle: Secondary | ICD-10-CM | POA: Diagnosis not present

## 2016-06-11 DIAGNOSIS — N840 Polyp of corpus uteri: Secondary | ICD-10-CM | POA: Diagnosis not present

## 2016-06-24 DIAGNOSIS — Z09 Encounter for follow-up examination after completed treatment for conditions other than malignant neoplasm: Secondary | ICD-10-CM | POA: Diagnosis not present

## 2016-10-08 DIAGNOSIS — I10 Essential (primary) hypertension: Secondary | ICD-10-CM | POA: Diagnosis not present

## 2016-10-08 DIAGNOSIS — E559 Vitamin D deficiency, unspecified: Secondary | ICD-10-CM | POA: Diagnosis not present

## 2016-10-08 DIAGNOSIS — E7800 Pure hypercholesterolemia, unspecified: Secondary | ICD-10-CM | POA: Diagnosis not present

## 2016-10-18 DIAGNOSIS — E559 Vitamin D deficiency, unspecified: Secondary | ICD-10-CM | POA: Diagnosis not present

## 2016-10-18 DIAGNOSIS — I10 Essential (primary) hypertension: Secondary | ICD-10-CM | POA: Diagnosis not present

## 2016-10-18 DIAGNOSIS — Z0000 Encounter for general adult medical examination without abnormal findings: Secondary | ICD-10-CM | POA: Diagnosis not present

## 2017-03-14 DIAGNOSIS — Z01419 Encounter for gynecological examination (general) (routine) without abnormal findings: Secondary | ICD-10-CM | POA: Diagnosis not present

## 2017-03-14 DIAGNOSIS — Z1231 Encounter for screening mammogram for malignant neoplasm of breast: Secondary | ICD-10-CM | POA: Diagnosis not present

## 2018-02-10 DIAGNOSIS — I10 Essential (primary) hypertension: Secondary | ICD-10-CM | POA: Diagnosis not present

## 2018-02-10 DIAGNOSIS — E559 Vitamin D deficiency, unspecified: Secondary | ICD-10-CM | POA: Diagnosis not present

## 2018-02-10 DIAGNOSIS — Z0000 Encounter for general adult medical examination without abnormal findings: Secondary | ICD-10-CM | POA: Diagnosis not present

## 2018-02-16 DIAGNOSIS — E559 Vitamin D deficiency, unspecified: Secondary | ICD-10-CM | POA: Diagnosis not present

## 2018-02-16 DIAGNOSIS — Z91018 Allergy to other foods: Secondary | ICD-10-CM | POA: Diagnosis not present

## 2018-02-16 DIAGNOSIS — I10 Essential (primary) hypertension: Secondary | ICD-10-CM | POA: Diagnosis not present

## 2018-02-16 DIAGNOSIS — Z0000 Encounter for general adult medical examination without abnormal findings: Secondary | ICD-10-CM | POA: Diagnosis not present

## 2018-06-26 DIAGNOSIS — M9902 Segmental and somatic dysfunction of thoracic region: Secondary | ICD-10-CM | POA: Diagnosis not present

## 2018-06-26 DIAGNOSIS — M5441 Lumbago with sciatica, right side: Secondary | ICD-10-CM | POA: Diagnosis not present

## 2018-06-26 DIAGNOSIS — M9903 Segmental and somatic dysfunction of lumbar region: Secondary | ICD-10-CM | POA: Diagnosis not present

## 2018-06-26 DIAGNOSIS — M546 Pain in thoracic spine: Secondary | ICD-10-CM | POA: Diagnosis not present

## 2018-06-28 DIAGNOSIS — M5441 Lumbago with sciatica, right side: Secondary | ICD-10-CM | POA: Diagnosis not present

## 2018-06-28 DIAGNOSIS — M9902 Segmental and somatic dysfunction of thoracic region: Secondary | ICD-10-CM | POA: Diagnosis not present

## 2018-06-28 DIAGNOSIS — M546 Pain in thoracic spine: Secondary | ICD-10-CM | POA: Diagnosis not present

## 2018-06-28 DIAGNOSIS — M9903 Segmental and somatic dysfunction of lumbar region: Secondary | ICD-10-CM | POA: Diagnosis not present

## 2018-06-30 DIAGNOSIS — M5441 Lumbago with sciatica, right side: Secondary | ICD-10-CM | POA: Diagnosis not present

## 2018-06-30 DIAGNOSIS — M9902 Segmental and somatic dysfunction of thoracic region: Secondary | ICD-10-CM | POA: Diagnosis not present

## 2018-06-30 DIAGNOSIS — M546 Pain in thoracic spine: Secondary | ICD-10-CM | POA: Diagnosis not present

## 2018-06-30 DIAGNOSIS — M9903 Segmental and somatic dysfunction of lumbar region: Secondary | ICD-10-CM | POA: Diagnosis not present

## 2018-07-03 DIAGNOSIS — M9903 Segmental and somatic dysfunction of lumbar region: Secondary | ICD-10-CM | POA: Diagnosis not present

## 2018-07-03 DIAGNOSIS — M546 Pain in thoracic spine: Secondary | ICD-10-CM | POA: Diagnosis not present

## 2018-07-03 DIAGNOSIS — M9902 Segmental and somatic dysfunction of thoracic region: Secondary | ICD-10-CM | POA: Diagnosis not present

## 2018-07-03 DIAGNOSIS — M5441 Lumbago with sciatica, right side: Secondary | ICD-10-CM | POA: Diagnosis not present

## 2018-07-05 DIAGNOSIS — M9902 Segmental and somatic dysfunction of thoracic region: Secondary | ICD-10-CM | POA: Diagnosis not present

## 2018-07-05 DIAGNOSIS — M546 Pain in thoracic spine: Secondary | ICD-10-CM | POA: Diagnosis not present

## 2018-07-05 DIAGNOSIS — M9903 Segmental and somatic dysfunction of lumbar region: Secondary | ICD-10-CM | POA: Diagnosis not present

## 2018-07-05 DIAGNOSIS — M5441 Lumbago with sciatica, right side: Secondary | ICD-10-CM | POA: Diagnosis not present

## 2018-07-12 DIAGNOSIS — M9902 Segmental and somatic dysfunction of thoracic region: Secondary | ICD-10-CM | POA: Diagnosis not present

## 2018-07-12 DIAGNOSIS — M5441 Lumbago with sciatica, right side: Secondary | ICD-10-CM | POA: Diagnosis not present

## 2018-07-12 DIAGNOSIS — M9903 Segmental and somatic dysfunction of lumbar region: Secondary | ICD-10-CM | POA: Diagnosis not present

## 2018-07-12 DIAGNOSIS — M546 Pain in thoracic spine: Secondary | ICD-10-CM | POA: Diagnosis not present

## 2018-07-14 DIAGNOSIS — M9902 Segmental and somatic dysfunction of thoracic region: Secondary | ICD-10-CM | POA: Diagnosis not present

## 2018-07-14 DIAGNOSIS — M5441 Lumbago with sciatica, right side: Secondary | ICD-10-CM | POA: Diagnosis not present

## 2018-07-14 DIAGNOSIS — M9903 Segmental and somatic dysfunction of lumbar region: Secondary | ICD-10-CM | POA: Diagnosis not present

## 2018-07-14 DIAGNOSIS — M546 Pain in thoracic spine: Secondary | ICD-10-CM | POA: Diagnosis not present

## 2018-07-19 DIAGNOSIS — M5441 Lumbago with sciatica, right side: Secondary | ICD-10-CM | POA: Diagnosis not present

## 2018-07-19 DIAGNOSIS — M9903 Segmental and somatic dysfunction of lumbar region: Secondary | ICD-10-CM | POA: Diagnosis not present

## 2018-07-19 DIAGNOSIS — M9902 Segmental and somatic dysfunction of thoracic region: Secondary | ICD-10-CM | POA: Diagnosis not present

## 2018-07-19 DIAGNOSIS — M546 Pain in thoracic spine: Secondary | ICD-10-CM | POA: Diagnosis not present

## 2018-07-21 DIAGNOSIS — M9903 Segmental and somatic dysfunction of lumbar region: Secondary | ICD-10-CM | POA: Diagnosis not present

## 2018-07-21 DIAGNOSIS — M546 Pain in thoracic spine: Secondary | ICD-10-CM | POA: Diagnosis not present

## 2018-07-21 DIAGNOSIS — M9902 Segmental and somatic dysfunction of thoracic region: Secondary | ICD-10-CM | POA: Diagnosis not present

## 2018-07-21 DIAGNOSIS — M5441 Lumbago with sciatica, right side: Secondary | ICD-10-CM | POA: Diagnosis not present

## 2018-07-26 DIAGNOSIS — M9903 Segmental and somatic dysfunction of lumbar region: Secondary | ICD-10-CM | POA: Diagnosis not present

## 2018-07-26 DIAGNOSIS — M5441 Lumbago with sciatica, right side: Secondary | ICD-10-CM | POA: Diagnosis not present

## 2018-07-26 DIAGNOSIS — M9902 Segmental and somatic dysfunction of thoracic region: Secondary | ICD-10-CM | POA: Diagnosis not present

## 2018-07-26 DIAGNOSIS — M546 Pain in thoracic spine: Secondary | ICD-10-CM | POA: Diagnosis not present

## 2018-08-02 DIAGNOSIS — M5441 Lumbago with sciatica, right side: Secondary | ICD-10-CM | POA: Diagnosis not present

## 2018-08-02 DIAGNOSIS — M9903 Segmental and somatic dysfunction of lumbar region: Secondary | ICD-10-CM | POA: Diagnosis not present

## 2018-08-02 DIAGNOSIS — M9902 Segmental and somatic dysfunction of thoracic region: Secondary | ICD-10-CM | POA: Diagnosis not present

## 2018-08-02 DIAGNOSIS — M546 Pain in thoracic spine: Secondary | ICD-10-CM | POA: Diagnosis not present

## 2018-08-04 DIAGNOSIS — M5441 Lumbago with sciatica, right side: Secondary | ICD-10-CM | POA: Diagnosis not present

## 2018-08-04 DIAGNOSIS — M9902 Segmental and somatic dysfunction of thoracic region: Secondary | ICD-10-CM | POA: Diagnosis not present

## 2018-08-04 DIAGNOSIS — M546 Pain in thoracic spine: Secondary | ICD-10-CM | POA: Diagnosis not present

## 2018-08-04 DIAGNOSIS — M9903 Segmental and somatic dysfunction of lumbar region: Secondary | ICD-10-CM | POA: Diagnosis not present

## 2018-08-17 ENCOUNTER — Ambulatory Visit (INDEPENDENT_AMBULATORY_CARE_PROVIDER_SITE_OTHER): Payer: BLUE CROSS/BLUE SHIELD | Admitting: Orthopaedic Surgery

## 2018-08-17 ENCOUNTER — Other Ambulatory Visit: Payer: Self-pay

## 2018-08-17 ENCOUNTER — Encounter: Payer: Self-pay | Admitting: Orthopaedic Surgery

## 2018-08-17 ENCOUNTER — Ambulatory Visit: Payer: Self-pay

## 2018-08-17 VITALS — BP 166/115 | HR 82 | Ht 61.0 in | Wt 149.0 lb

## 2018-08-17 DIAGNOSIS — M5441 Lumbago with sciatica, right side: Secondary | ICD-10-CM | POA: Diagnosis not present

## 2018-08-17 DIAGNOSIS — G8929 Other chronic pain: Secondary | ICD-10-CM

## 2018-08-17 NOTE — Progress Notes (Signed)
Office Visit Note   Patient: Kathleen Orozco           Date of Birth: 1971/01/06           MRN: [id number] Visit Date: 08/17/2018              Requested by: Tommy Medal, [address], [place],  [place] [address] PCP: Tommy Medal, MD   Assessment & Plan: Visit Diagnoses:  1. Chronic right-sided low back pain with right-sided sciatica     Plan: Chronic back pain with radicular right leg pain.  She has some EHL weakness and positive nerve root tension signs and is failed chiropractic treatments anti-inflammatories activity modification.  Her symptoms bother her on a daily basis when she is working.  Will obtain an MRI scan lumbar spine office follow-up after scan for review.  Follow-Up Instructions: Return after lumbar MRI scan.  Orders:  Orders Placed This Encounter  Procedures  . XR Lumbar Spine 2-3 Views   No orders of the defined types were placed in this encounter.     Procedures: No procedures performed   Clinical Data: No additional findings.   Subjective: Chief Complaint  Patient presents with  . Lower Back - Pain  . Right Leg - Numbness, Pain    HPI 48 year old female referred by [facility] for problems with spondylosis and back and right leg sciatica.  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  She states she has been out of work for the last 2 weeks and her symptoms of gotten better.  She is tried Aleve without improvement.  She has not had an MRI scan.  Plain radiograph showed some spondylosis at L2-3 and L4-5 with sacralization at L5.  Osteophytes were noted at L2-3.  She has been through multiple chiropractic treatments without significant improvement in her symptoms.  She denies fever chills no bowel or bladder symptoms.  Pain radiates from her back down her right leg to the dorsum of her foot.  Review of Systems positive for hypertension on medication.  Positive for history of rectal  and anal polyps, GERD otherwise negative.  Positive for migraines.  Previous gallbladder removal.  Ectopic pregnancy.  Removal of fallopian tubes.  Otherwise 14 point systems noncontributory to HPI.   Objective: Vital Signs: BP (!) 166/115   Pulse 82   Ht 5\' 1"  (1.549 m)   Wt 149 lb (67.6 kg)   BMI 28.15 kg/m   Physical Exam Constitutional:      Appearance: She is well-developed.  HENT:     Head: Normocephalic.     Right Ear: External ear normal.     Left Ear: External ear normal.  Eyes:     Pupils: Pupils are equal, round, and reactive to light.  Neck:     Thyroid: No thyromegaly.     Trachea: No tracheal deviation.  Cardiovascular:     Rate and Rhythm: Normal rate.  Pulmonary:     Effort: Pulmonary effort is normal.  Abdominal:     Palpations: Abdomen is soft.  Skin:    General: Skin is warm and dry.  Neurological:     Mental Status: She is alert and oriented to person, place, and time.  Psychiatric:        Behavior: Behavior normal.     Ortho Exam normal cervical range of motion.  Negative Spurling upper extremity reflexes are 2+.  Forward flexion fingertips to ankles she has  lumbar pain with resuming upright.  Pain with straight leg raising on the right at 80 degrees.  Trace EHL weakness on the right normal on the left anterior tib is strong and symmetrical no gastrocsoleus weakness.  Knee and ankle jerk is intact and symmetrical.  Specialty Comments:  No specialty comments available.  Imaging: No results found.   PMFS History: Patient Active Problem List   Diagnosis Date Noted  . Anal and rectal polyp 01/04/2012   Past Medical History:  Diagnosis Date  . Anemia    in the past  . Hypertension     Family History  Problem Relation Age of Onset  . Hypertension Maternal Grandmother   . Breast cancer Cousin     Past Surgical History:  Procedure Laterality Date  . CHOLECYSTECTOMY    . LAPAROSCOPIC BILATERAL SALPINGECTOMY Right 07/19/2013   Procedure:  OPERATIVE LAPAROSCOPY, LYSIS OF ADHESIONS, LAPAROSCOPIC RIGHT SALPINGECTOMY;  Surgeon: Betsy Coder, MD;  Location: [facility];  Service: Gynecology;  Laterality: Right;  . LAPAROSCOPIC SALPINGO OOPHERECTOMY    . MASS EXCISION  03/23/2012   anal   Social History   Occupational History  . Not on file  Tobacco Use  . Smoking status: Never Smoker  . Smokeless tobacco: Never Used  Substance and Sexual Activity  . Alcohol use: Yes    Comment: socially  . Drug use: No  . Sexual activity: Yes    Birth control/protection: Pill

## 2018-09-13 ENCOUNTER — Ambulatory Visit
Admission: RE | Admit: 2018-09-13 | Discharge: 2018-09-13 | Disposition: A | Discharge status: Home / Self Care | Payer: BLUE CROSS/BLUE SHIELD | Source: Ambulatory Visit | Attending: Orthopaedic Surgery | Admitting: Orthopaedic Surgery

## 2018-09-13 ENCOUNTER — Other Ambulatory Visit: Payer: Self-pay

## 2018-09-13 DIAGNOSIS — G8929 Other chronic pain: Secondary | ICD-10-CM

## 2018-09-13 DIAGNOSIS — M5441 Lumbago with sciatica, right side: Secondary | ICD-10-CM | POA: Diagnosis not present

## 2018-09-13 DIAGNOSIS — M5117 Intervertebral disc disorders with radiculopathy, lumbosacral region: Secondary | ICD-10-CM | POA: Diagnosis not present

## 2018-09-19 ENCOUNTER — Encounter: Payer: Self-pay | Admitting: Orthopaedic Surgery

## 2018-09-19 ENCOUNTER — Ambulatory Visit (INDEPENDENT_AMBULATORY_CARE_PROVIDER_SITE_OTHER): Payer: BLUE CROSS/BLUE SHIELD | Admitting: Orthopaedic Surgery

## 2018-09-19 ENCOUNTER — Ambulatory Visit (INDEPENDENT_AMBULATORY_CARE_PROVIDER_SITE_OTHER): Payer: BLUE CROSS/BLUE SHIELD

## 2018-09-19 VITALS — BP 163/108 | HR 67 | Ht 61.0 in | Wt 149.0 lb

## 2018-09-19 DIAGNOSIS — M542 Cervicalgia: Secondary | ICD-10-CM

## 2018-09-19 DIAGNOSIS — M5441 Lumbago with sciatica, right side: Secondary | ICD-10-CM

## 2018-09-19 DIAGNOSIS — M48061 Spinal stenosis, lumbar region without neurogenic claudication: Secondary | ICD-10-CM | POA: Diagnosis not present

## 2018-09-19 DIAGNOSIS — G8929 Other chronic pain: Secondary | ICD-10-CM

## 2018-09-19 NOTE — Progress Notes (Signed)
Office Visit Note   Patient: Kathleen Orozco           Date of Birth: 08/27/1970           MRN: [id number] Visit Date: 09/19/2018              Requested by: Tommy Medal, [address], [place],  [place] [address] PCP: Tommy Medal, MD   Assessment & Plan: Visit Diagnoses:  1. Neck pain   2. Chronic right-sided low back pain with right-sided sciatica   3. Lumbar foraminal stenosis     Plan: We will set her up for a foraminal injection for her L5-S1 annular tear and right L5 foraminal stenosis.  Office follow-up after injection for review.  Follow-Up Instructions: Return after right L5 foraminal ESI.  Orders:  Orders Placed This Encounter  Procedures  . XR Cervical Spine 2 or 3 views  . Ambulatory referral to Physical Medicine Rehab   No orders of the defined types were placed in this encounter.     Procedures: No procedures performed   Clinical Data: No additional findings.   Subjective: Chief Complaint  Patient presents with  . Lower Back - Pain, Follow-up    MRI Lumbar Review  . Right Shoulder - Pain, Follow-up    HPI 48 year old female follow-up with both neck and low back pain.  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  MRI scan is been obtained lumbar and is available for review.  She has been through extensive chiropractic treatments and persistent problems with back and right leg pain.  She also has continued neck pain and neck radiographs were obtained today.  Patient states she has had tightness in her shoulder particularly with overhead reaching outstretch reaching.  Back pain bothers her with bending and twisting as well as lifting.  She is used ibuprofen and Aleve with some relief.  Review of Systems post for hypertension on medication.  Positive history of anal polyps, GERD.  Positive for migraines currently not symptomatic.  Previous cholecystectomy, ectopic pregnancy.  Removal of fallopian tubes.   Posture chronic neck pain as well as low back pain, chronic right .   Objective: Vital Signs: BP (!) 163/108   Pulse 67   Ht 5\' 1"  (1.549 m)   Wt 149 lb (67.6 kg)   BMI 28.15 kg/m   Physical Exam Constitutional:      Appearance: She is well-developed.  HENT:     Head: Normocephalic.     Right Ear: External ear normal.     Left Ear: External ear normal.  Eyes:     Pupils: Pupils are equal, round, and reactive to light.  Neck:     Thyroid: No thyromegaly.     Trachea: No tracheal deviation.  Cardiovascular:     Rate and Rhythm: Normal rate.  Pulmonary:     Effort: Pulmonary effort is normal.  Abdominal:     Palpations: Abdomen is soft.  Skin:    General: Skin is warm and dry.  Neurological:     Mental Status: She is alert and oriented to person, place, and time.  Psychiatric:        Behavior: Behavior normal.     Ortho Exam patient has normal cervical range of motion.  Negative Spurling's some brachial plexus tenderness on the right.  She can get her arm up overhead mild positive impingement.  Positive straight leg raising on the right leg 80 degrees negative on  the left.  Trace EHL weakness on the right.  Gastrocsoleus is strong.  Negative popliteal compression test right and left  Specialty Comments:  No specialty comments available.  Imaging: CLINICAL DATA:  Radiculopathy  EXAM: MRI LUMBAR SPINE WITHOUT CONTRAST  TECHNIQUE: Multiplanar, multisequence MR imaging of the lumbar spine was performed. No intravenous contrast was administered.  COMPARISON:  None.  FINDINGS: Segmentation: There are 5 non-rib bearing lumbar type vertebral bodies with the last intervertebral disc space labeled as L5-S1. for the purposes of this dictation there is a rudimentary disc seen at S1-S2.  Alignment:  Normal  Vertebrae: The vertebral body heights are well maintained. No fracture, marrow edema,or pathologic marrow infiltration.  Conus medullaris and cauda equina:  Conus extends to the this level. Conus and cauda equina appear normal.  Paraspinal and other soft tissues: The paraspinal soft tissues and visualized retroperitoneal structures are unremarkable. The sacroiliac joints are intact.  Disc levels:  T12-L1:  No significant canal or neural foraminal narrowing.  L1-L2:   No significant canal or neural foraminal narrowing.  L2-L3: There is a minimal broad-based disc bulge, however no significant canal or neural foraminal narrowing.  L3-L4: There is a minimal broad-based disc bulge and ligamentum flavum hypertrophy which causes mild bilateral neural foraminal narrowing.  L4-L5: There is a minimal broad-based disc bulge with ligamentum flavum hypertrophy which causes mild bilateral neural foraminal narrowing.  L5-S1: There is a broad-based disc bulge with a central disc protrusion and tiny annular fissure. The disc protrusion contacts the bilateral descending S1 nerve roots. There is moderate to severe bilateral neural foraminal narrowing. Mild effacement anterior thecal sac is seen.  IMPRESSION: 1. Transitional vertebrae with a Rudimentary disc seen at S1-S2 2. Lumbar spine spondylosis most notable at L5-S1 with a central disc protrusion and tiny annular fissure. The disc protrusion contacts the bilateral descending S1 nerve roots. There is also moderate to severe bilateral neural foraminal narrowing at this level.   Electronically Signed   By: Prudencio Pair M.D.   On: 09/13/2018 11:28   PMFS History: Patient Active Problem List   Diagnosis Date Noted  . Lumbar foraminal stenosis 09/19/2018  . Anal and rectal polyp 01/04/2012   Past Medical History:  Diagnosis Date  . Anemia    in the past  . Hypertension     Family History  Problem Relation Age of Onset  . Hypertension Maternal Grandmother   . Breast cancer Cousin     Past Surgical History:  Procedure Laterality Date  . CHOLECYSTECTOMY    .  LAPAROSCOPIC BILATERAL SALPINGECTOMY Right 07/19/2013   Procedure: OPERATIVE LAPAROSCOPY, LYSIS OF ADHESIONS, LAPAROSCOPIC RIGHT SALPINGECTOMY;  Surgeon: Betsy Coder, MD;  Location: [facility];  Service: Gynecology;  Laterality: Right;  . LAPAROSCOPIC SALPINGO OOPHERECTOMY    . MASS EXCISION  03/23/2012   anal   Social History   Occupational History  . Not on file  Tobacco Use  . Smoking status: Never Smoker  . Smokeless tobacco: Never Used  Substance and Sexual Activity  . Alcohol use: Yes    Comment: socially  . Drug use: No  . Sexual activity: Yes    Birth control/protection: Pill

## 2018-10-12 ENCOUNTER — Ambulatory Visit (INDEPENDENT_AMBULATORY_CARE_PROVIDER_SITE_OTHER): Payer: BLUE CROSS/BLUE SHIELD | Admitting: Physical Medicine and Rehabilitation

## 2018-10-12 ENCOUNTER — Ambulatory Visit: Payer: Self-pay

## 2018-10-12 ENCOUNTER — Encounter: Payer: Self-pay | Admitting: Physical Medicine and Rehabilitation

## 2018-10-12 VITALS — BP 157/100 | HR 76

## 2018-10-12 DIAGNOSIS — M5416 Radiculopathy, lumbar region: Secondary | ICD-10-CM

## 2018-10-12 MED ORDER — BETAMETHASONE SOD PHOS & ACET 6 (3-3) MG/ML IJ SUSP
12.0000 mg | Freq: Once | INTRAMUSCULAR | Status: AC
  Administered 2018-10-12: 15:00:00 12 mg

## 2018-10-12 NOTE — Progress Notes (Signed)
 .  Numeric Pain Rating Scale and Functional Assessment Average Pain 8   In the last MONTH (on 0-10 scale) has pain interfered with the following?  1. General activity like being  able to carry out your everyday physical activities such as walking, climbing stairs, carrying groceries, or moving a chair?  Rating(6)   +Driver, -BT, -Dye Allergies.  

## 2018-10-30 NOTE — Progress Notes (Signed)
Kathleen Orozco - 48 y.o. female MRN [id number]  Date of birth: [birth year]  Office Visit Note: Visit Date: 10/12/2018 PCP: Tommy Medal, MD Referred by: Tommy Medal, MD  Subjective: Chief Complaint  Patient presents with  . Lower Back - Pain  . Right Leg - Pain   HPI:  Kathleen Orozco is a 48 y.o. female who comes in today At the request of Dr. Rodell Perna for right L5 or S1 transforaminal epidural steroid injection for right radicular leg pain which is failed conservative care otherwise.  MRI does show disc protrusion on the right at L5-S1.  S1 is lumbar lysed and transitional.  ROS Otherwise per HPI.  Assessment & Plan: Visit Diagnoses:  1. Lumbar radiculopathy     Plan: No additional findings.   Meds & Orders:  Meds ordered this encounter  Medications  . betamethasone acetate-betamethasone sodium phosphate (CELESTONE) injection 12 mg    Orders Placed This Encounter  Procedures  . XR C-ARM NO REPORT  . Epidural Steroid injection    Follow-up: Return if symptoms worsen or fail to improve.   Procedures: No procedures performed  Lumbosacral Transforaminal Epidural Steroid Injection - Sub-Pedicular Approach with Fluoroscopic Guidance  Patient: Kathleen Orozco      Date of Birth: [birth year] MRN: [id number] PCP: Tommy Medal, MD      Visit Date: 10/12/2018   Universal Protocol:    Date/Time: 10/12/2018  Consent Given By: the patient  Position: PRONE  Additional Comments: Vital signs were monitored before and after the procedure. Patient was prepped and draped in the usual sterile fashion. The correct patient, procedure, and site was verified.   Injection Procedure Details:  Procedure Site One Meds Administered:  Meds ordered this encounter  Medications  . betamethasone acetate-betamethasone sodium phosphate (CELESTONE) injection 12 mg    Laterality: Right  Location/Site: Lumbarized S1 segment L5-S1  Needle size: 22 G  Needle type: Spinal   Needle Placement: Transforaminal  Findings:    -Comments: Excellent flow of contrast along the nerve and into the epidural space.  Procedure Details: After squaring off the end-plates to get a true AP view, the C-arm was positioned so that an oblique view of the foramen as noted above was visualized. The target area is just inferior to the "nose of the scotty dog" or sub pedicular. The soft tissues overlying this structure were infiltrated with 2-3 ml. of 1% Lidocaine without Epinephrine.  The spinal needle was inserted toward the target using a "trajectory" view along the fluoroscope beam.  Under AP and lateral visualization, the needle was advanced so it did not puncture dura and was located close the 6 O'Clock position of the pedical in AP tracterory. Biplanar projections were used to confirm position. Aspiration was confirmed to be negative for CSF and/or blood. A 1-2 ml. volume of Isovue-250 was injected and flow of contrast was noted at each level. Radiographs were obtained for documentation purposes.   After attaining the desired flow of contrast documented above, a 0.5 to 1.0 ml test dose of 0.25% Marcaine was injected into each respective transforaminal space.  The patient was observed for 90 seconds post injection.  After no sensory deficits were reported, and normal lower extremity motor function was noted,   the above injectate was administered so that equal amounts of the injectate were placed at each foramen (level) into the transforaminal epidural space.   Additional Comments:  The patient tolerated the procedure well Dressing: 2 x 2 sterile gauze and  Band-Aid    Post-procedure details: Patient was observed during the procedure. Post-procedure instructions were reviewed.  Patient left the clinic in stable condition.    Clinical History: MRI LUMBAR SPINE WITHOUT CONTRAST  TECHNIQUE: Multiplanar, multisequence MR imaging of the lumbar spine was performed. No intravenous  contrast was administered.  COMPARISON:  None.  FINDINGS: Segmentation: There are 5 non-rib bearing lumbar type vertebral bodies with the last intervertebral disc space labeled as L5-S1. for the purposes of this dictation there is a rudimentary disc seen at S1-S2.  Alignment:  Normal  Vertebrae: The vertebral body heights are well maintained. No fracture, marrow edema,or pathologic marrow infiltration.  Conus medullaris and cauda equina: Conus extends to the this level. Conus and cauda equina appear normal.  Paraspinal and other soft tissues: The paraspinal soft tissues and visualized retroperitoneal structures are unremarkable. The sacroiliac joints are intact.  Disc levels:  T12-L1:  No significant canal or neural foraminal narrowing.  L1-L2:   No significant canal or neural foraminal narrowing.  L2-L3: There is a minimal broad-based disc bulge, however no significant canal or neural foraminal narrowing.  L3-L4: There is a minimal broad-based disc bulge and ligamentum flavum hypertrophy which causes mild bilateral neural foraminal narrowing.  L4-L5: There is a minimal broad-based disc bulge with ligamentum flavum hypertrophy which causes mild bilateral neural foraminal narrowing.  L5-S1: There is a broad-based disc bulge with a central disc protrusion and tiny annular fissure. The disc protrusion contacts the bilateral descending S1 nerve roots. There is moderate to severe bilateral neural foraminal narrowing. Mild effacement anterior thecal sac is seen.  IMPRESSION: 1. Transitional vertebrae with a Rudimentary disc seen at S1-S2 2. Lumbar spine spondylosis most notable at L5-S1 with a central disc protrusion and tiny annular fissure. The disc protrusion contacts the bilateral descending S1 nerve roots. There is also moderate to severe bilateral neural foraminal narrowing at this level.   Electronically Signed   By: Prudencio Pair M.D.   On:  09/13/2018 11:28     Objective:  VS:  HT:    WT:   BMI:     BP:(!) 157/100  HR:76bpm  TEMP: ( )  RESP:  Physical Exam  Ortho Exam Imaging: No results found.

## 2018-10-30 NOTE — Procedures (Signed)
Lumbosacral Transforaminal Epidural Steroid Injection - Sub-Pedicular Approach with Fluoroscopic Guidance  Patient: Kathleen Orozco      Date of Birth: November 05, 1970 MRN: [id number] PCP: Tommy Medal, MD      Visit Date: 10/12/2018   Universal Protocol:    Date/Time: 10/12/2018  Consent Given By: the patient  Position: PRONE  Additional Comments: Vital signs were monitored before and after the procedure. Patient was prepped and draped in the usual sterile fashion. The correct patient, procedure, and site was verified.   Injection Procedure Details:  Procedure Site One Meds Administered:  Meds ordered this encounter  Medications  . betamethasone acetate-betamethasone sodium phosphate (CELESTONE) injection 12 mg    Laterality: Right  Location/Site: Lumbarized S1 segment L5-S1  Needle size: 22 G  Needle type: Spinal  Needle Placement: Transforaminal  Findings:    -Comments: Excellent flow of contrast along the nerve and into the epidural space.  Procedure Details: After squaring off the end-plates to get a true AP view, the C-arm was positioned so that an oblique view of the foramen as noted above was visualized. The target area is just inferior to the "nose of the scotty dog" or sub pedicular. The soft tissues overlying this structure were infiltrated with 2-3 ml. of 1% Lidocaine without Epinephrine.  The spinal needle was inserted toward the target using a "trajectory" view along the fluoroscope beam.  Under AP and lateral visualization, the needle was advanced so it did not puncture dura and was located close the 6 O'Clock position of the pedical in AP tracterory. Biplanar projections were used to confirm position. Aspiration was confirmed to be negative for CSF and/or blood. A 1-2 ml. volume of Isovue-250 was injected and flow of contrast was noted at each level. Radiographs were obtained for documentation purposes.   After attaining the desired flow of contrast  documented above, a 0.5 to 1.0 ml test dose of 0.25% Marcaine was injected into each respective transforaminal space.  The patient was observed for 90 seconds post injection.  After no sensory deficits were reported, and normal lower extremity motor function was noted,   the above injectate was administered so that equal amounts of the injectate were placed at each foramen (level) into the transforaminal epidural space.   Additional Comments:  The patient tolerated the procedure well Dressing: 2 x 2 sterile gauze and Band-Aid    Post-procedure details: Patient was observed during the procedure. Post-procedure instructions were reviewed.  Patient left the clinic in stable condition.

## 2018-12-08 ENCOUNTER — Other Ambulatory Visit: Payer: Self-pay

## 2018-12-08 DIAGNOSIS — Z20822 Contact with and (suspected) exposure to covid-19: Secondary | ICD-10-CM

## 2018-12-11 LAB — NOVEL CORONAVIRUS, NAA: SARS-CoV-2, NAA: NOT DETECTED

## 2019-02-26 DIAGNOSIS — Z299 Encounter for prophylactic measures, unspecified: Secondary | ICD-10-CM | POA: Diagnosis not present

## 2019-02-26 DIAGNOSIS — J320 Chronic maxillary sinusitis: Secondary | ICD-10-CM | POA: Diagnosis not present

## 2019-02-26 DIAGNOSIS — Z20822 Contact with and (suspected) exposure to covid-19: Secondary | ICD-10-CM | POA: Diagnosis not present

## 2019-04-05 ENCOUNTER — Ambulatory Visit: Payer: BLUE CROSS/BLUE SHIELD | Attending: Internal Medicine

## 2019-04-05 DIAGNOSIS — Z23 Encounter for immunization: Secondary | ICD-10-CM

## 2019-04-05 NOTE — Progress Notes (Signed)
   Covid-19 Vaccination Clinic  Name:  Kathleen Orozco    MRN: [id number] DOB: Aug 28, 1970  04/05/2019  [name] was observed post Covid-19 immunization for 15 minutes without incident. She was provided with Vaccine Information Sheet and instruction to access the V-Safe system.   [name] was instructed to call 911 with any severe reactions post vaccine: Marland Kitchen Difficulty breathing  . Swelling of face and throat  . A fast heartbeat  . A bad rash all over body  . Dizziness and weakness   Immunizations Administered    Name Date Dose VIS Date Route   Pfizer COVID-19 Vaccine 04/05/2019  1:08 PM 0.3 mL 01/05/2019 Intramuscular   Manufacturer: [place]   Lot: [id number]   [place]: [id number]

## 2019-04-19 DIAGNOSIS — Z1211 Encounter for screening for malignant neoplasm of colon: Secondary | ICD-10-CM | POA: Diagnosis not present

## 2019-04-19 DIAGNOSIS — Z124 Encounter for screening for malignant neoplasm of cervix: Secondary | ICD-10-CM | POA: Diagnosis not present

## 2019-04-19 DIAGNOSIS — Z1239 Encounter for other screening for malignant neoplasm of breast: Secondary | ICD-10-CM | POA: Diagnosis not present

## 2019-04-19 DIAGNOSIS — Z1231 Encounter for screening mammogram for malignant neoplasm of breast: Secondary | ICD-10-CM | POA: Diagnosis not present

## 2019-04-19 DIAGNOSIS — Z01419 Encounter for gynecological examination (general) (routine) without abnormal findings: Secondary | ICD-10-CM | POA: Diagnosis not present

## 2019-04-24 LAB — RESULTS CONSOLE HPV: CHL HPV: NEGATIVE

## 2019-04-24 LAB — HM PAP SMEAR: HM Pap smear: NEGATIVE

## 2019-04-30 ENCOUNTER — Ambulatory Visit: Payer: BLUE CROSS/BLUE SHIELD | Attending: Internal Medicine

## 2019-04-30 DIAGNOSIS — Z23 Encounter for immunization: Secondary | ICD-10-CM

## 2019-04-30 NOTE — Progress Notes (Signed)
   Covid-19 Vaccination Clinic  Name:  Kathleen Orozco    MRN: [id number] DOB: [birth year]  04/30/2019  [name] was observed post Covid-19 immunization for 15 minutes without incident. She was provided with Vaccine Information Sheet and instruction to access the V-Safe system.   [name] was instructed to call 911 with any severe reactions post vaccine: Marland Kitchen Difficulty breathing  . Swelling of face and throat  . A fast heartbeat  . A bad rash all over body  . Dizziness and weakness   Immunizations Administered    Name Date Dose VIS Date Route   Pfizer COVID-19 Vaccine 04/30/2019  4:10 PM 0.3 mL 01/05/2019 Intramuscular   Manufacturer: [employer]   Lot: [id number]   [place]: [id number]

## 2019-06-01 DIAGNOSIS — E559 Vitamin D deficiency, unspecified: Secondary | ICD-10-CM | POA: Diagnosis not present

## 2019-06-01 DIAGNOSIS — Z0000 Encounter for general adult medical examination without abnormal findings: Secondary | ICD-10-CM | POA: Diagnosis not present

## 2019-06-01 DIAGNOSIS — I10 Essential (primary) hypertension: Secondary | ICD-10-CM | POA: Diagnosis not present

## 2019-06-08 DIAGNOSIS — Z0000 Encounter for general adult medical examination without abnormal findings: Secondary | ICD-10-CM | POA: Diagnosis not present

## 2019-06-08 DIAGNOSIS — E559 Vitamin D deficiency, unspecified: Secondary | ICD-10-CM | POA: Diagnosis not present

## 2019-08-16 DIAGNOSIS — Z1211 Encounter for screening for malignant neoplasm of colon: Secondary | ICD-10-CM | POA: Diagnosis not present

## 2019-10-22 DIAGNOSIS — Z1211 Encounter for screening for malignant neoplasm of colon: Secondary | ICD-10-CM | POA: Diagnosis not present

## 2019-10-22 DIAGNOSIS — K633 Ulcer of intestine: Secondary | ICD-10-CM | POA: Diagnosis not present

## 2019-10-22 DIAGNOSIS — K5000 Crohn's disease of small intestine without complications: Secondary | ICD-10-CM | POA: Diagnosis not present

## 2019-10-22 DIAGNOSIS — K5730 Diverticulosis of large intestine without perforation or abscess without bleeding: Secondary | ICD-10-CM | POA: Diagnosis not present

## 2019-12-16 ENCOUNTER — Other Ambulatory Visit: Payer: Self-pay

## 2019-12-16 ENCOUNTER — Encounter (HOSPITAL_COMMUNITY): Payer: Self-pay

## 2019-12-16 ENCOUNTER — Ambulatory Visit (HOSPITAL_COMMUNITY)
Admission: EM | Admit: 2019-12-16 | Discharge: 2019-12-16 | Disposition: A | Discharge status: Home / Self Care | Payer: BC Managed Care – PPO | Attending: Family Medicine | Admitting: Family Medicine

## 2019-12-16 DIAGNOSIS — R21 Rash and other nonspecific skin eruption: Secondary | ICD-10-CM

## 2019-12-16 MED ORDER — CLOTRIMAZOLE-BETAMETHASONE 1-0.05 % EX CREA: 1.0000 "application " | TOPICAL_CREAM | Freq: Two times a day (BID) | CUTANEOUS | 0 refills | Status: AC

## 2019-12-16 NOTE — ED Provider Notes (Signed)
[facility]    CSN: [id number] Arrival date & time: 12/16/19  1015      History   Chief Complaint Chief Complaint  Patient presents with  . Rash    HPI Kathleen Orozco is a 49 y.o. female.   HPI  Patient has a rash across her back.  It has been present for a couple of days.  Terribly itchy.  It is not responding to cortisone cream.  She is used no new products.  No new soaps.  She states she has been wearing leggings underneath her clothes because of the cold weather.  No pain.  No fever Past Medical History:  Diagnosis Date  . Anemia    in the past  . Hypertension     Patient Active Problem List   Diagnosis Date Noted  . Lumbar foraminal stenosis 09/19/2018  . Anal and rectal polyp 01/04/2012    Past Surgical History:  Procedure Laterality Date  . CHOLECYSTECTOMY    . LAPAROSCOPIC BILATERAL SALPINGECTOMY Right 07/19/2013   Procedure: OPERATIVE LAPAROSCOPY, LYSIS OF ADHESIONS, LAPAROSCOPIC RIGHT SALPINGECTOMY;  Surgeon: Betsy Coder, MD;  Location: [facility];  Service: Gynecology;  Laterality: Right;  . LAPAROSCOPIC SALPINGO OOPHERECTOMY    . MASS EXCISION  03/23/2012   anal    OB History   No obstetric history on file.      Home Medications    Prior to Admission medications   Medication Sig Start Date End Date Taking? Authorizing Provider  clotrimazole-betamethasone (LOTRISONE) cream Apply 1 application topically 2 (two) times daily. 12/16/19   Raylene Everts, MD  losartan-hydrochlorothiazide (HYZAAR) 100-25 MG tablet losartan 100 mg-hydrochlorothiazide 25 mg tablet    [provider]  valsartan-hydrochlorothiazide (DIOVAN-HCT) 160-25 MG per tablet Take 1 tablet by mouth daily.    [provider]  Vitamin D, Ergocalciferol, (DRISDOL) 50000 UNITS CAPS Take 50,000 Units by mouth.    [provider]    Family History Family History  Problem Relation Age of Onset  . Hypertension Maternal Grandmother   . Breast  cancer Cousin     Social History Social History   Tobacco Use  . Smoking status: Never Smoker  . Smokeless tobacco: Never Used  Substance Use Topics  . Alcohol use: Yes    Comment: socially  . Drug use: No     Allergies   Reglan [metoclopramide]   Review of Systems Review of Systems See HPI  Physical Exam Triage Vital Signs ED Triage Vitals  Enc Vitals Group     BP 12/16/19 1128 (!) 164/97     Pulse Rate 12/16/19 1128 90     Resp 12/16/19 1128 20     Temp 12/16/19 1128 98.6 F (37 C)     Temp Source 12/16/19 1128 Oral     SpO2 12/16/19 1128 98 %     Weight --      Height --      Head Circumference --      Peak Flow --      Pain Score 12/16/19 1217 0     Pain Loc --      Pain Edu? --      Excl. in [place]? --    No data found.  Updated Vital Signs BP (!) 164/97 (BP Location: Right Arm)   Pulse 90   Temp 98.6 F (37 C) (Oral)   Resp 20   SpO2 98%     Physical Exam Constitutional:      General:  She is not in acute distress.    Appearance: She is well-developed and normal weight.  HENT:     Head: Normocephalic and atraumatic.     Mouth/Throat:     Comments: Mask is in place Eyes:     Conjunctiva/sclera: Conjunctivae normal.     Pupils: Pupils are equal, round, and reactive to light.  Cardiovascular:     Rate and Rhythm: Normal rate.  Pulmonary:     Effort: Pulmonary effort is normal. No respiratory distress.  Abdominal:     Palpations: Abdomen is soft.  Musculoskeletal:        General: Normal range of motion.     Cervical back: Normal range of motion.       Back:  Skin:    General: Skin is warm and dry.  Neurological:     Mental Status: She is alert.      UC Treatments / Results  Labs (all labs ordered are listed, but only abnormal results are displayed) Labs Reviewed - No data to display  EKG   Radiology No results found.  Procedures Procedures (including critical care time)  Medications Ordered in UC Medications - No data to  display  Initial Impression / Assessment and Plan / UC Course  I have reviewed the triage vital signs and the nursing notes.  Pertinent labs & imaging results that were available during my care of the patient were reviewed by me and considered in my medical decision making (see chart for details).     Patient states rash is not responding to cortisone.  It looks like an eczematous type of rash for irritant, however, there may be some fungal element.  We will treat with Lotrisone.  Return as needed Final Clinical Impressions(s) / UC Diagnoses   Final diagnoses:  Rash and nonspecific skin eruption     Discharge Instructions     Apply lotrisone 2 x a day Use antihistamines for itching ( benadryl) Return as needed   ED Prescriptions    Medication Sig Dispense Auth. Provider   clotrimazole-betamethasone (LOTRISONE) cream Apply 1 application topically 2 (two) times daily. 30 g Raylene Everts, MD     PDMP not reviewed this encounter.   Raylene Everts, MD 12/16/19 1224

## 2019-12-16 NOTE — ED Triage Notes (Signed)
Pt in with c/o rash on back around her waist line that she noticed on Friday. States she noticed itching on Wednesday but didn't see the rash.  States she been using Cortisone cream with no relief

## 2019-12-16 NOTE — Discharge Instructions (Addendum)
Apply lotrisone 2 x a day Use antihistamines for itching ( benadryl) Return as needed

## 2019-12-25 ENCOUNTER — Encounter: Payer: Self-pay | Admitting: Family Medicine

## 2019-12-25 ENCOUNTER — Ambulatory Visit: Payer: BC Managed Care – PPO | Admitting: Family Medicine

## 2019-12-25 ENCOUNTER — Other Ambulatory Visit: Payer: Self-pay

## 2019-12-25 VITALS — BP 130/80 | HR 72 | Temp 98.5°F | Wt 151.4 lb

## 2019-12-25 DIAGNOSIS — G8929 Other chronic pain: Secondary | ICD-10-CM

## 2019-12-25 DIAGNOSIS — M25511 Pain in right shoulder: Secondary | ICD-10-CM

## 2019-12-25 MED ORDER — MELOXICAM 15 MG PO TABS: 15.0000 mg | ORAL_TABLET | Freq: Every day | ORAL | 0 refills | Status: AC

## 2019-12-25 NOTE — Progress Notes (Signed)
   Subjective:    Patient ID: Kathleen Orozco, female    DOB: [birth year], 49 y.o.   MRN: [id number]  HPI Chief Complaint  Patient presents with  . new pt    new pt, shoulder pain x years but getting worse within the last month or two   She is new to the practice here for an acute complaint of right shoulder pain.  Previous medical care: [facility]. She has been going there for 10 years or so.  Last there in early 2021 for a CPE.   Complains of right shoulder pain for several years. States pain has been getting worse over the past 2 months. No injury. Pain is constant.  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 Certain movements make her pain worse.  She can lay on her right side.  She is using Biofreeze. Takes Tylenol 1,000 mg as needed.   She gets massages once monthly  States she has been to the chiropractor for this issue but the last time was in 2020.  Last year she saw her orthopedist.   Denies fever, chills, dizziness, chest pain, palpitations, shortness of breath, abdominal pain, N/V/D.  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    Review of Systems Pertinent positives and negatives in the history of present illness.     Objective:   Physical Exam Constitutional:      General: She is not in acute distress.    Appearance: Normal appearance. She is not ill-appearing.  Musculoskeletal:     Right shoulder: Tenderness present. No crepitus. Normal range of motion. Normal strength. Normal pulse.     Left shoulder: Normal.     Right upper arm: Normal.     Left upper arm: Normal.     Right wrist: Normal.     Left wrist: Normal.     Right hand: Normal.     Left hand: Normal.     Cervical back: Normal range of motion and neck supple.     Comments: Posterior shoulder TTP  Skin:    General: Skin is warm and dry.     Capillary Refill: Capillary refill takes less than 2 seconds.    Neurological:     Mental Status: She is alert and oriented to person, place, and time.     Sensory: Sensation is intact.     Motor: Motor function is intact.    BP 130/80   Pulse 72   Temp 98.5 F (36.9 C)   Wt 151 lb 6.4 oz (68.7 kg)   BMI 28.61 kg/m       Assessment & Plan:  Chronic right shoulder pain - Plan: meloxicam (MOBIC) 15 MG tablet  She is new to the practice and here for an acute complaint of worsening right shoulder pain.  Her exam is unremarkable and no red flag symptoms.  Discussed conservative treatment including ice or heat, topical analgesic and trying meloxicam.  I also recommend that she take Pepcid or a PPI with this since she has a history of GI upset.  It is unclear as to whether she is establishing care here or not since she does have a PCP.  Discussed if she is not improving significantly in the next 2 to 4 weeks that our next step would be to refer to physical therapy or back to her orthopedist.

## 2019-12-25 NOTE — Patient Instructions (Signed)
Try the meloxicam once daily.  You may also want to take Pepcid or Prilosec with this. Use ice or heat and a topical pain medication such as Salonpas, or anything with lidocaine.  If you do not notice significant improvement in 2 to 4 weeks, you may want to get some physical therapy for follow-up with your orthopedist.

## 2020-01-21 ENCOUNTER — Other Ambulatory Visit: Payer: Self-pay | Admitting: Family Medicine

## 2020-01-21 DIAGNOSIS — G8929 Other chronic pain: Secondary | ICD-10-CM

## 2020-01-21 NOTE — Telephone Encounter (Signed)
Pt. Request refill on Meloxicam last apt was 12/25/19 new pt. Apt. Has no future apt. Last filled 12/25/19.

## 2020-01-21 NOTE — Telephone Encounter (Signed)
If she needs a refill for continued pain then I recommend that she follow up with her orthopedist or consider PT.

## 2020-01-22 NOTE — Telephone Encounter (Signed)
Called pt. LM stating needs to f/u with ortho or get PT.

## 2020-01-22 NOTE — Telephone Encounter (Signed)
Pt called back states she does not need a refill at this time

## 2020-05-02 ENCOUNTER — Encounter: Payer: Self-pay | Admitting: Internal Medicine

## 2020-05-22 DIAGNOSIS — Z01419 Encounter for gynecological examination (general) (routine) without abnormal findings: Secondary | ICD-10-CM | POA: Diagnosis not present

## 2020-05-22 DIAGNOSIS — Z1239 Encounter for other screening for malignant neoplasm of breast: Secondary | ICD-10-CM | POA: Diagnosis not present

## 2020-05-22 DIAGNOSIS — Z1231 Encounter for screening mammogram for malignant neoplasm of breast: Secondary | ICD-10-CM | POA: Diagnosis not present

## 2020-05-22 LAB — HM MAMMOGRAPHY

## 2020-05-26 DIAGNOSIS — H60311 Diffuse otitis externa, right ear: Secondary | ICD-10-CM | POA: Diagnosis not present

## 2020-05-29 ENCOUNTER — Encounter: Payer: Self-pay | Admitting: Internal Medicine

## 2020-05-30 DIAGNOSIS — H60313 Diffuse otitis externa, bilateral: Secondary | ICD-10-CM | POA: Diagnosis not present

## 2020-06-03 DIAGNOSIS — H60313 Diffuse otitis externa, bilateral: Secondary | ICD-10-CM | POA: Diagnosis not present

## 2020-07-04 DIAGNOSIS — R7301 Impaired fasting glucose: Secondary | ICD-10-CM | POA: Diagnosis not present

## 2020-07-04 DIAGNOSIS — Z0000 Encounter for general adult medical examination without abnormal findings: Secondary | ICD-10-CM | POA: Diagnosis not present

## 2020-07-11 DIAGNOSIS — R739 Hyperglycemia, unspecified: Secondary | ICD-10-CM | POA: Diagnosis not present

## 2020-07-11 DIAGNOSIS — Z0000 Encounter for general adult medical examination without abnormal findings: Secondary | ICD-10-CM | POA: Diagnosis not present

## 2021-02-18 IMAGING — MR MRI LUMBAR SPINE WITHOUT CONTRAST
4 of 5 series · 27 of 48 positions shown · non-contrast
Comparison: None.

CLINICAL DATA: Radiculopathy

EXAM:
MRI LUMBAR SPINE WITHOUT CONTRAST
TECHNIQUE: Multiplanar, multisequence MR imaging of the lumbar spine was
performed. No intravenous contrast was administered.

[Series 2: T2 · sagittal · 4.0mm · 1.09mm/px · 6 of 16 slices shown (1 of 2)]
[im 1/16]
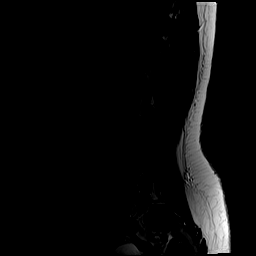
[im 4/16]
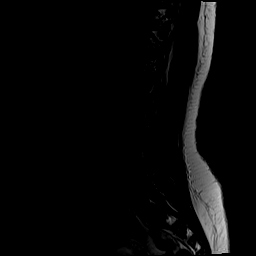
[im 7/16]
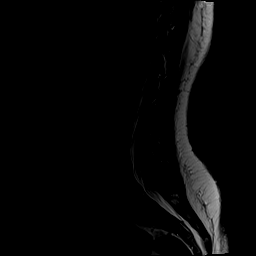
[im 10/16]
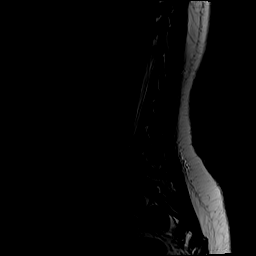
[im 13/16]
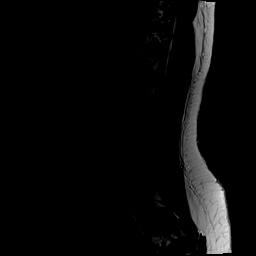
[im 16/16]
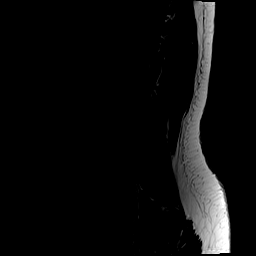

[Series 4: T1 · sagittal · 4.0mm · 1.09mm/px · 6 of 16 slices shown (1 of 2)]
[im 1/16]
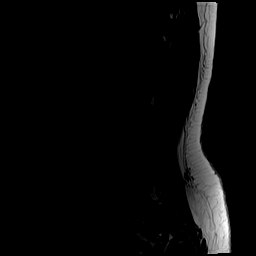
[im 4/16]
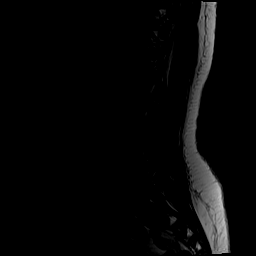
[im 7/16]
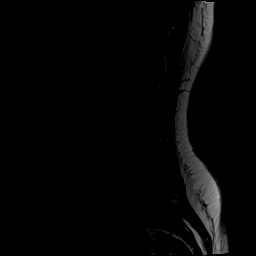
[im 10/16]
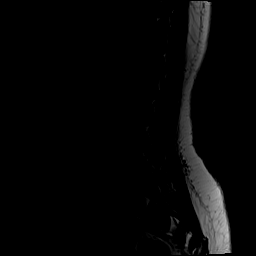
[im 13/16]
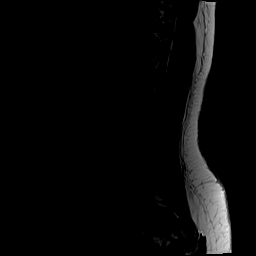
[im 16/16]
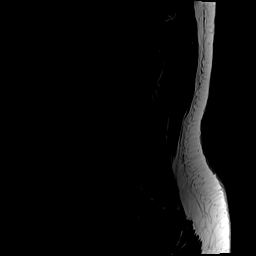

[Series 5: T2 · axial · 4.0mm · 0.39mm/px · z∈[-88,+112]mm · 9 of 38 slices shown (2 of 2)]
[im 1/38]
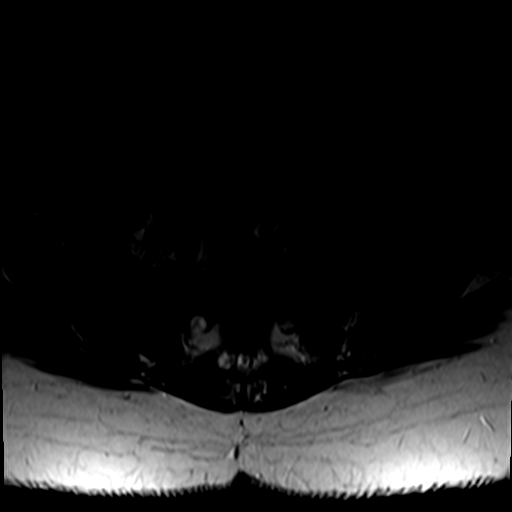
[im 6/38]
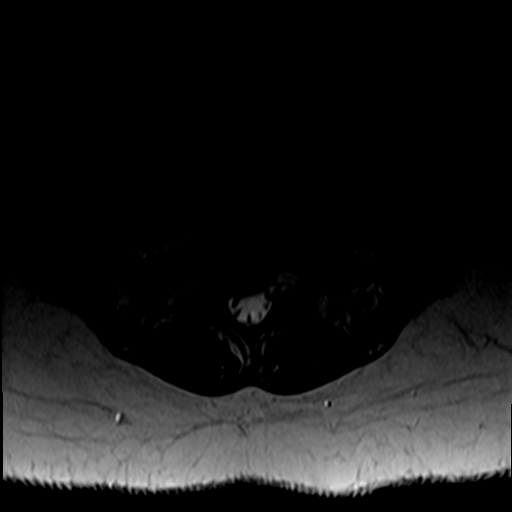
[im 11/38]
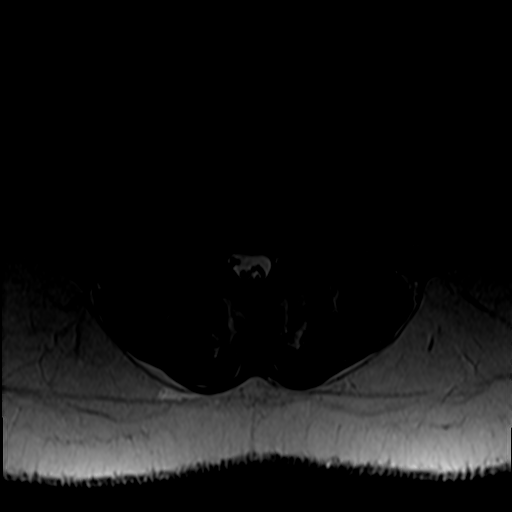
[im 16/38]
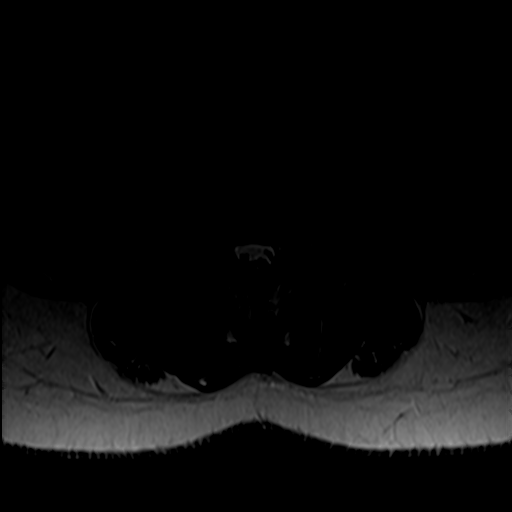
[im 19/38]
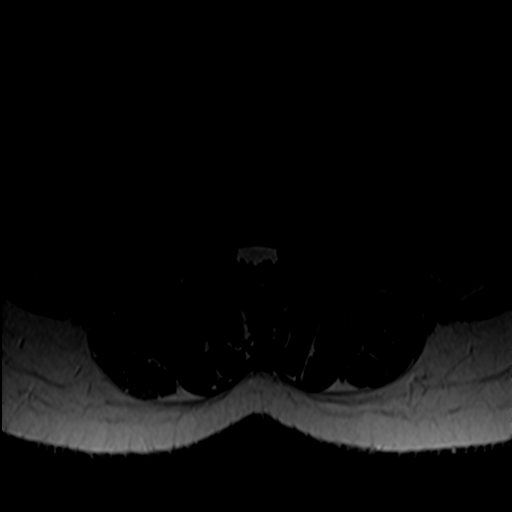
[im 22/38]
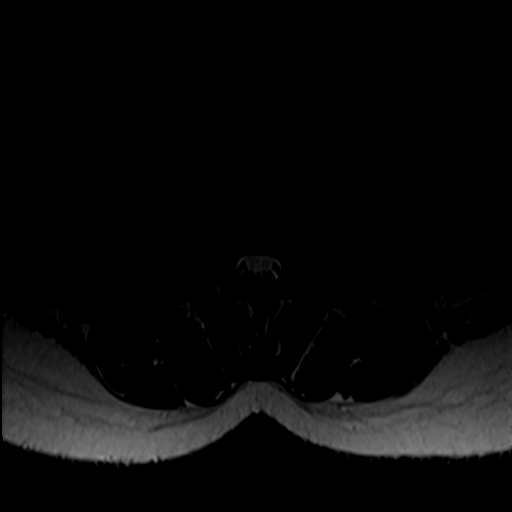
[im 27/38]
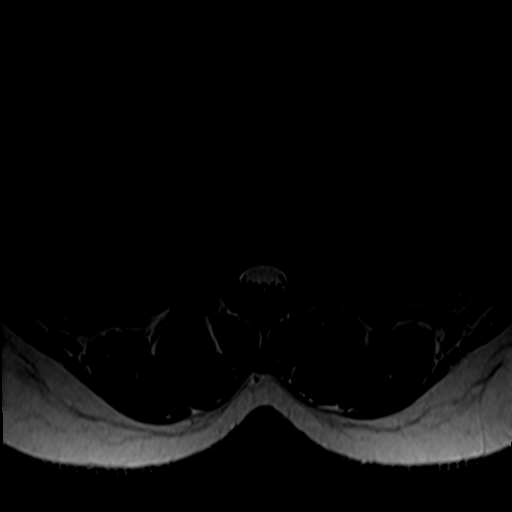
[im 32/38]
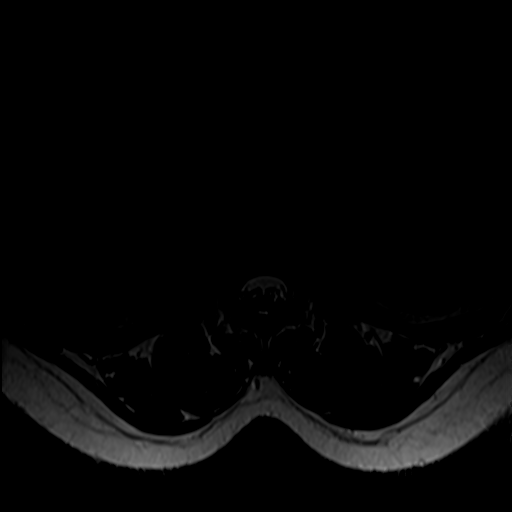
[im 38/38]
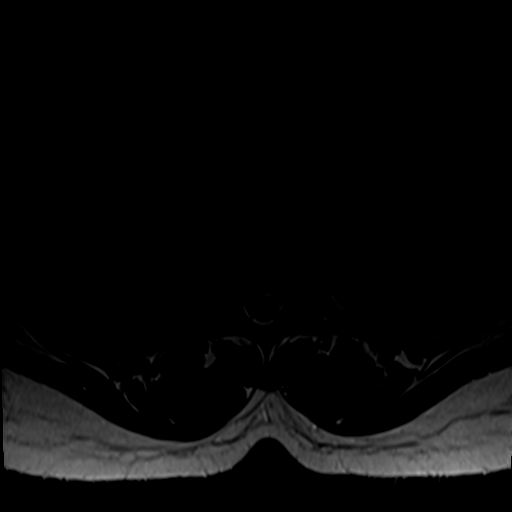

[Series 6: T1 · axial · 4.0mm · 0.39mm/px · z∈[-88,+83]mm · 6 of 38 slices shown (2 of 2)]
[im 1/38]
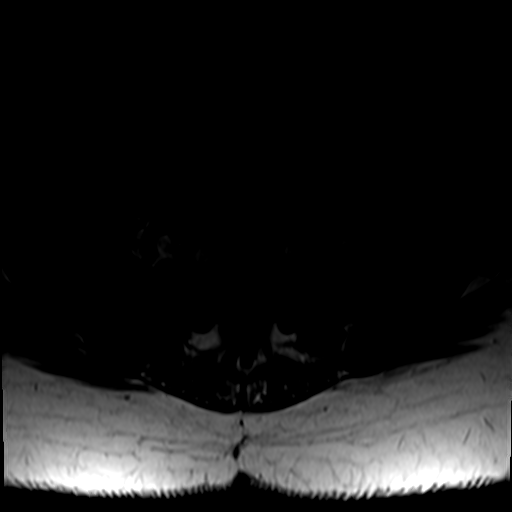
[im 6/38]
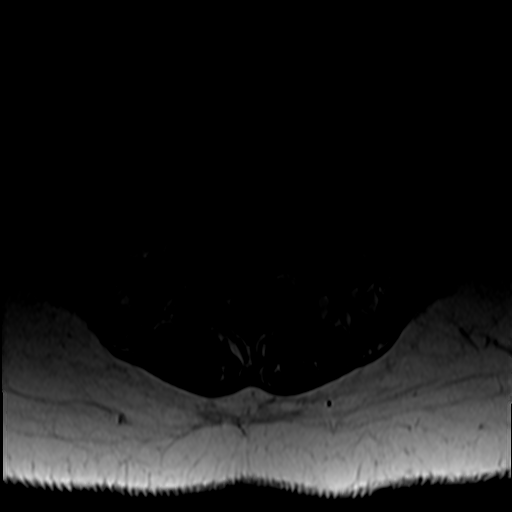
[im 11/38]
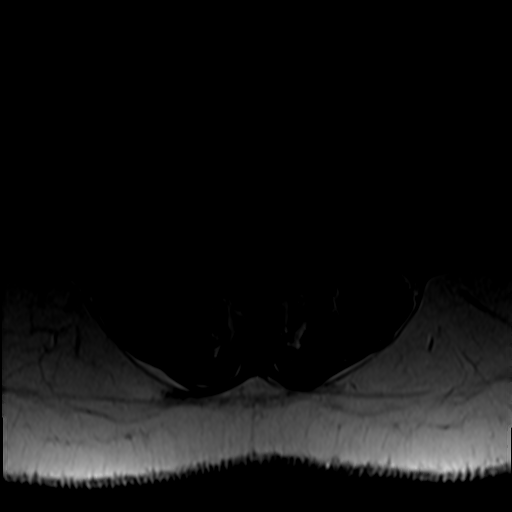
[im 16/38]
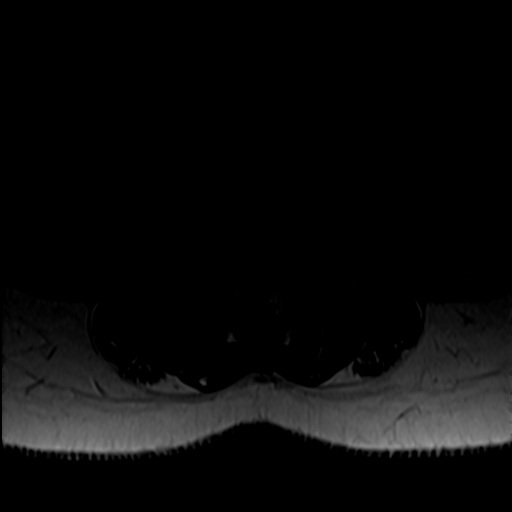
[im 19/38]
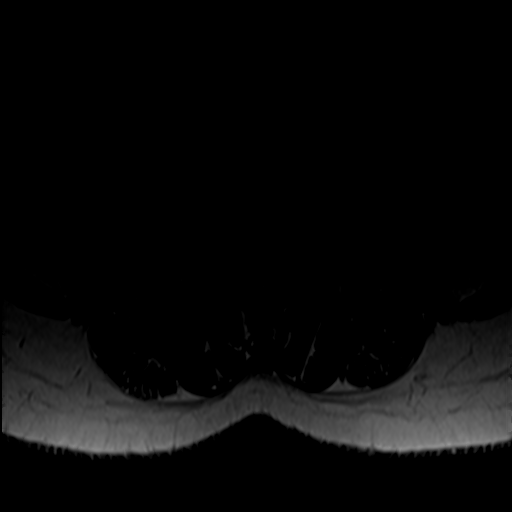
[im 32/38]
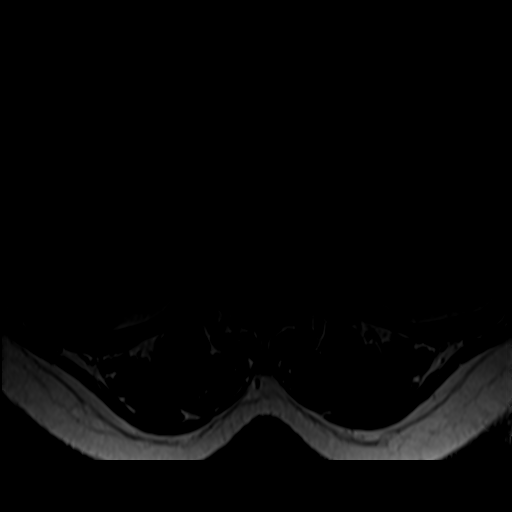

[27 of 48 positions shown; findings below may reference images not displayed]

FINDINGS: Segmentation: There are 5 non-rib bearing lumbar type vertebral
bodies with the last intervertebral disc space labeled as L5-S1. for
the purposes of this dictation there is a rudimentary disc seen at
S1-S2.

Alignment:  Normal

Vertebrae: The vertebral body heights are well maintained. No
fracture, marrow edema,or pathologic marrow infiltration.

Conus medullaris and cauda equina: Conus extends to the this level.
Conus and cauda equina appear normal.

Paraspinal and other soft tissues: The paraspinal soft tissues and
visualized retroperitoneal structures are unremarkable. The
sacroiliac joints are intact.

Disc levels:

T12-L1:  No significant canal or neural foraminal narrowing.

L1-L2:   No significant canal or neural foraminal narrowing.

L2-L3: There is a minimal broad-based disc bulge, however no
significant canal or neural foraminal narrowing.

L3-L4: There is a minimal broad-based disc bulge and ligamentum
flavum hypertrophy which causes mild bilateral neural foraminal
narrowing.

L4-L5: There is a minimal broad-based disc bulge with ligamentum
flavum hypertrophy which causes mild bilateral neural foraminal
narrowing.

L5-S1: There is a broad-based disc bulge with a central disc
protrusion and tiny annular fissure. The disc protrusion contacts
the bilateral descending S1 nerve roots. There is moderate to severe
bilateral neural foraminal narrowing. Mild effacement anterior
thecal sac is seen.
IMPRESSION: 1. Transitional vertebrae with a Rudimentary disc seen at S1-S2
2. Lumbar spine spondylosis most notable at L5-S1 with a central
disc protrusion and tiny annular fissure. The disc protrusion
contacts the bilateral descending S1 nerve roots. There is also
moderate to severe bilateral neural foraminal narrowing at this
level.

## 2021-08-14 DIAGNOSIS — R739 Hyperglycemia, unspecified: Secondary | ICD-10-CM | POA: Diagnosis not present

## 2021-08-14 DIAGNOSIS — E7800 Pure hypercholesterolemia, unspecified: Secondary | ICD-10-CM | POA: Diagnosis not present

## 2021-08-14 DIAGNOSIS — E559 Vitamin D deficiency, unspecified: Secondary | ICD-10-CM | POA: Diagnosis not present

## 2021-08-14 DIAGNOSIS — Z0000 Encounter for general adult medical examination without abnormal findings: Secondary | ICD-10-CM | POA: Diagnosis not present

## 2021-08-21 ENCOUNTER — Other Ambulatory Visit: Payer: Self-pay | Admitting: Registered Nurse

## 2021-08-21 DIAGNOSIS — E1165 Type 2 diabetes mellitus with hyperglycemia: Secondary | ICD-10-CM | POA: Diagnosis not present

## 2021-08-21 DIAGNOSIS — Z0000 Encounter for general adult medical examination without abnormal findings: Secondary | ICD-10-CM | POA: Diagnosis not present

## 2021-08-21 DIAGNOSIS — I10 Essential (primary) hypertension: Secondary | ICD-10-CM | POA: Diagnosis not present

## 2021-08-21 DIAGNOSIS — Z8249 Family history of ischemic heart disease and other diseases of the circulatory system: Secondary | ICD-10-CM

## 2021-08-21 DIAGNOSIS — E559 Vitamin D deficiency, unspecified: Secondary | ICD-10-CM | POA: Diagnosis not present

## 2021-09-23 DIAGNOSIS — E7800 Pure hypercholesterolemia, unspecified: Secondary | ICD-10-CM | POA: Diagnosis not present

## 2021-09-23 DIAGNOSIS — E559 Vitamin D deficiency, unspecified: Secondary | ICD-10-CM | POA: Diagnosis not present

## 2021-09-23 DIAGNOSIS — E1165 Type 2 diabetes mellitus with hyperglycemia: Secondary | ICD-10-CM | POA: Diagnosis not present

## 2021-09-23 DIAGNOSIS — I10 Essential (primary) hypertension: Secondary | ICD-10-CM | POA: Diagnosis not present

## 2021-10-23 DIAGNOSIS — Z1239 Encounter for other screening for malignant neoplasm of breast: Secondary | ICD-10-CM | POA: Diagnosis not present

## 2021-10-23 DIAGNOSIS — Z01411 Encounter for gynecological examination (general) (routine) with abnormal findings: Secondary | ICD-10-CM | POA: Diagnosis not present

## 2021-10-23 DIAGNOSIS — Z1231 Encounter for screening mammogram for malignant neoplasm of breast: Secondary | ICD-10-CM | POA: Diagnosis not present

## 2021-10-23 DIAGNOSIS — Z6829 Body mass index (BMI) 29.0-29.9, adult: Secondary | ICD-10-CM | POA: Diagnosis not present

## 2021-10-23 DIAGNOSIS — Z01419 Encounter for gynecological examination (general) (routine) without abnormal findings: Secondary | ICD-10-CM | POA: Diagnosis not present

## 2022-02-02 DIAGNOSIS — E1165 Type 2 diabetes mellitus with hyperglycemia: Secondary | ICD-10-CM | POA: Diagnosis not present

## 2022-02-09 DIAGNOSIS — E1165 Type 2 diabetes mellitus with hyperglycemia: Secondary | ICD-10-CM | POA: Diagnosis not present

## 2022-04-09 ENCOUNTER — Other Ambulatory Visit: Payer: Self-pay | Admitting: Obstetrics and Gynecology

## 2022-04-09 DIAGNOSIS — N939 Abnormal uterine and vaginal bleeding, unspecified: Secondary | ICD-10-CM

## 2022-04-12 DIAGNOSIS — M9905 Segmental and somatic dysfunction of pelvic region: Secondary | ICD-10-CM | POA: Diagnosis not present

## 2022-04-12 DIAGNOSIS — M9903 Segmental and somatic dysfunction of lumbar region: Secondary | ICD-10-CM | POA: Diagnosis not present

## 2022-04-12 DIAGNOSIS — M9902 Segmental and somatic dysfunction of thoracic region: Secondary | ICD-10-CM | POA: Diagnosis not present

## 2022-04-12 DIAGNOSIS — M62830 Muscle spasm of back: Secondary | ICD-10-CM | POA: Diagnosis not present

## 2022-04-22 DIAGNOSIS — M62830 Muscle spasm of back: Secondary | ICD-10-CM | POA: Diagnosis not present

## 2022-04-22 DIAGNOSIS — M9902 Segmental and somatic dysfunction of thoracic region: Secondary | ICD-10-CM | POA: Diagnosis not present

## 2022-04-22 DIAGNOSIS — M9905 Segmental and somatic dysfunction of pelvic region: Secondary | ICD-10-CM | POA: Diagnosis not present

## 2022-04-22 DIAGNOSIS — M9903 Segmental and somatic dysfunction of lumbar region: Secondary | ICD-10-CM | POA: Diagnosis not present

## 2022-05-11 ENCOUNTER — Other Ambulatory Visit: Payer: BC Managed Care – PPO

## 2022-05-14 DIAGNOSIS — E1165 Type 2 diabetes mellitus with hyperglycemia: Secondary | ICD-10-CM | POA: Diagnosis not present

## 2022-05-25 DIAGNOSIS — E1165 Type 2 diabetes mellitus with hyperglycemia: Secondary | ICD-10-CM | POA: Diagnosis not present

## 2022-05-25 DIAGNOSIS — E559 Vitamin D deficiency, unspecified: Secondary | ICD-10-CM | POA: Diagnosis not present

## 2022-05-25 DIAGNOSIS — I10 Essential (primary) hypertension: Secondary | ICD-10-CM | POA: Diagnosis not present

## 2022-05-25 DIAGNOSIS — Z8249 Family history of ischemic heart disease and other diseases of the circulatory system: Secondary | ICD-10-CM | POA: Diagnosis not present

## 2022-05-26 ENCOUNTER — Other Ambulatory Visit (HOSPITAL_COMMUNITY): Payer: Self-pay | Admitting: Registered Nurse

## 2022-05-26 DIAGNOSIS — Z8249 Family history of ischemic heart disease and other diseases of the circulatory system: Secondary | ICD-10-CM

## 2022-05-28 ENCOUNTER — Other Ambulatory Visit: Payer: BC Managed Care – PPO

## 2022-06-04 ENCOUNTER — Ambulatory Visit
Admission: RE | Admit: 2022-06-04 | Discharge: 2022-06-04 | Disposition: A | Discharge status: Home / Self Care | Payer: BC Managed Care – PPO | Source: Ambulatory Visit | Attending: Obstetrics and Gynecology | Admitting: Obstetrics and Gynecology

## 2022-06-04 DIAGNOSIS — N939 Abnormal uterine and vaginal bleeding, unspecified: Secondary | ICD-10-CM

## 2022-06-04 DIAGNOSIS — N838 Other noninflammatory disorders of ovary, fallopian tube and broad ligament: Secondary | ICD-10-CM | POA: Diagnosis not present

## 2022-06-04 DIAGNOSIS — N920 Excessive and frequent menstruation with regular cycle: Secondary | ICD-10-CM | POA: Diagnosis not present

## 2022-07-02 ENCOUNTER — Other Ambulatory Visit: Payer: Self-pay | Admitting: Obstetrics and Gynecology

## 2022-07-02 DIAGNOSIS — N939 Abnormal uterine and vaginal bleeding, unspecified: Secondary | ICD-10-CM | POA: Diagnosis not present

## 2022-07-02 DIAGNOSIS — N840 Polyp of corpus uteri: Secondary | ICD-10-CM | POA: Diagnosis not present

## 2022-07-02 DIAGNOSIS — N83201 Unspecified ovarian cyst, right side: Secondary | ICD-10-CM | POA: Diagnosis not present

## 2022-07-02 DIAGNOSIS — N83209 Unspecified ovarian cyst, unspecified side: Secondary | ICD-10-CM | POA: Diagnosis not present

## 2022-07-19 ENCOUNTER — Other Ambulatory Visit (HOSPITAL_COMMUNITY): Payer: BC Managed Care – PPO

## 2022-08-09 ENCOUNTER — Ambulatory Visit (HOSPITAL_COMMUNITY)
Admission: RE | Admit: 2022-08-09 | Discharge: 2022-08-09 | Disposition: A | Discharge status: Home / Self Care | Payer: BC Managed Care – PPO | Source: Ambulatory Visit | Attending: Registered Nurse | Admitting: Registered Nurse

## 2022-08-09 DIAGNOSIS — Z8249 Family history of ischemic heart disease and other diseases of the circulatory system: Secondary | ICD-10-CM | POA: Insufficient documentation

## 2022-08-27 DIAGNOSIS — E1165 Type 2 diabetes mellitus with hyperglycemia: Secondary | ICD-10-CM | POA: Diagnosis not present

## 2022-08-27 DIAGNOSIS — I10 Essential (primary) hypertension: Secondary | ICD-10-CM | POA: Diagnosis not present

## 2022-08-27 DIAGNOSIS — Z0001 Encounter for general adult medical examination with abnormal findings: Secondary | ICD-10-CM | POA: Diagnosis not present

## 2022-08-27 DIAGNOSIS — E559 Vitamin D deficiency, unspecified: Secondary | ICD-10-CM | POA: Diagnosis not present

## 2022-09-21 DIAGNOSIS — N939 Abnormal uterine and vaginal bleeding, unspecified: Secondary | ICD-10-CM | POA: Diagnosis not present

## 2022-10-20 DIAGNOSIS — Z1231 Encounter for screening mammogram for malignant neoplasm of breast: Secondary | ICD-10-CM | POA: Diagnosis not present

## 2022-10-20 DIAGNOSIS — N83291 Other ovarian cyst, right side: Secondary | ICD-10-CM | POA: Diagnosis not present

## 2022-10-20 DIAGNOSIS — Z124 Encounter for screening for malignant neoplasm of cervix: Secondary | ICD-10-CM | POA: Diagnosis not present

## 2022-10-20 DIAGNOSIS — Z01419 Encounter for gynecological examination (general) (routine) without abnormal findings: Secondary | ICD-10-CM | POA: Diagnosis not present

## 2022-10-20 DIAGNOSIS — Z01411 Encounter for gynecological examination (general) (routine) with abnormal findings: Secondary | ICD-10-CM | POA: Diagnosis not present

## 2023-07-11 DIAGNOSIS — I491 Atrial premature depolarization: Secondary | ICD-10-CM | POA: Diagnosis not present

## 2023-07-11 DIAGNOSIS — E1165 Type 2 diabetes mellitus with hyperglycemia: Secondary | ICD-10-CM | POA: Diagnosis not present

## 2023-07-11 DIAGNOSIS — I10 Essential (primary) hypertension: Secondary | ICD-10-CM | POA: Diagnosis not present

## 2023-08-02 DIAGNOSIS — I10 Essential (primary) hypertension: Secondary | ICD-10-CM | POA: Diagnosis not present

## 2023-08-26 DIAGNOSIS — E7800 Pure hypercholesterolemia, unspecified: Secondary | ICD-10-CM | POA: Diagnosis not present

## 2023-08-26 DIAGNOSIS — E559 Vitamin D deficiency, unspecified: Secondary | ICD-10-CM | POA: Diagnosis not present

## 2023-08-26 DIAGNOSIS — Z0000 Encounter for general adult medical examination without abnormal findings: Secondary | ICD-10-CM | POA: Diagnosis not present

## 2023-08-26 DIAGNOSIS — I10 Essential (primary) hypertension: Secondary | ICD-10-CM | POA: Diagnosis not present

## 2023-09-09 DIAGNOSIS — I10 Essential (primary) hypertension: Secondary | ICD-10-CM | POA: Diagnosis not present

## 2023-09-09 DIAGNOSIS — Z0000 Encounter for general adult medical examination without abnormal findings: Secondary | ICD-10-CM | POA: Diagnosis not present
